# Patient Record
Sex: Male | Born: 1965 | Race: White | Hispanic: No | Marital: Single | Smoking: Never smoker
Health system: Southern US, Community
[De-identification: ages and names within clinical notes are randomized; demographics above are authoritative.]

## PROBLEM LIST (undated history)

## (undated) DIAGNOSIS — J45909 Unspecified asthma, uncomplicated: Secondary | ICD-10-CM

## (undated) DIAGNOSIS — G43909 Migraine, unspecified, not intractable, without status migrainosus: Secondary | ICD-10-CM

## (undated) DIAGNOSIS — I10 Essential (primary) hypertension: Secondary | ICD-10-CM

## (undated) DIAGNOSIS — D860 Sarcoidosis of lung: Secondary | ICD-10-CM

## (undated) DIAGNOSIS — J189 Pneumonia, unspecified organism: Secondary | ICD-10-CM

## (undated) DIAGNOSIS — J4520 Mild intermittent asthma, uncomplicated: Secondary | ICD-10-CM

## (undated) HISTORY — DX: Migraine, unspecified, not intractable, without status migrainosus: G43.909

## (undated) HISTORY — DX: Unspecified asthma, uncomplicated: J45.909

## (undated) HISTORY — DX: Essential (primary) hypertension: I10

## (undated) HISTORY — DX: Pneumonia, unspecified organism: J18.9

## (undated) HISTORY — PX: BRONCHOSCOPY: SUR163

## (undated) HISTORY — PX: HERNIA REPAIR: SHX51

## (undated) HISTORY — DX: Sarcoidosis of lung: D86.0

## (undated) HISTORY — DX: Mild intermittent asthma, uncomplicated: J45.20

## (undated) HISTORY — PX: TONSILLECTOMY: SUR1361

---

## 1998-12-04 DIAGNOSIS — J189 Pneumonia, unspecified organism: Secondary | ICD-10-CM

## 1998-12-04 HISTORY — DX: Pneumonia, unspecified organism: J18.9

## 2004-12-09 ENCOUNTER — Ambulatory Visit: Payer: Self-pay | Admitting: Family Medicine

## 2004-12-15 ENCOUNTER — Ambulatory Visit: Payer: Self-pay | Admitting: Family Medicine

## 2005-12-04 HISTORY — PX: REPLACEMENT TOTAL KNEE BILATERAL: SUR1225

## 2006-01-03 ENCOUNTER — Ambulatory Visit: Payer: Self-pay | Admitting: Family Medicine

## 2006-02-07 ENCOUNTER — Ambulatory Visit: Payer: Self-pay | Admitting: Family Medicine

## 2009-03-26 ENCOUNTER — Encounter: Payer: Self-pay | Admitting: Pulmonary Disease

## 2009-04-13 ENCOUNTER — Encounter: Payer: Self-pay | Admitting: Pulmonary Disease

## 2009-04-16 ENCOUNTER — Encounter: Payer: Self-pay | Admitting: Pulmonary Disease

## 2009-04-19 ENCOUNTER — Encounter: Payer: Self-pay | Admitting: Pulmonary Disease

## 2009-04-22 ENCOUNTER — Ambulatory Visit: Payer: Self-pay | Admitting: Pulmonary Disease

## 2009-04-22 DIAGNOSIS — J309 Allergic rhinitis, unspecified: Secondary | ICD-10-CM | POA: Insufficient documentation

## 2009-04-26 ENCOUNTER — Telehealth: Payer: Self-pay | Admitting: Pulmonary Disease

## 2009-05-04 ENCOUNTER — Telehealth (INDEPENDENT_AMBULATORY_CARE_PROVIDER_SITE_OTHER): Payer: Self-pay | Admitting: *Deleted

## 2009-05-05 ENCOUNTER — Encounter: Payer: Self-pay | Admitting: Thoracic Surgery

## 2009-05-05 ENCOUNTER — Ambulatory Visit: Payer: Self-pay | Admitting: Pulmonary Disease

## 2009-05-05 ENCOUNTER — Ambulatory Visit (HOSPITAL_COMMUNITY): Admission: RE | Admit: 2009-05-05 | Discharge: 2009-05-05 | Payer: Self-pay | Admitting: Thoracic Surgery

## 2009-05-07 ENCOUNTER — Telehealth: Payer: Self-pay | Admitting: Pulmonary Disease

## 2009-05-10 ENCOUNTER — Encounter: Payer: Self-pay | Admitting: Pulmonary Disease

## 2009-05-19 ENCOUNTER — Ambulatory Visit: Payer: Self-pay | Admitting: Pulmonary Disease

## 2009-05-19 DIAGNOSIS — D869 Sarcoidosis, unspecified: Secondary | ICD-10-CM | POA: Insufficient documentation

## 2009-06-16 ENCOUNTER — Ambulatory Visit: Payer: Self-pay | Admitting: Pulmonary Disease

## 2009-06-16 ENCOUNTER — Encounter: Payer: Self-pay | Admitting: Pulmonary Disease

## 2009-06-16 DIAGNOSIS — G4733 Obstructive sleep apnea (adult) (pediatric): Secondary | ICD-10-CM | POA: Insufficient documentation

## 2009-07-30 ENCOUNTER — Ambulatory Visit: Payer: Self-pay | Admitting: Pulmonary Disease

## 2009-08-27 ENCOUNTER — Ambulatory Visit: Payer: Self-pay | Admitting: Pulmonary Disease

## 2009-09-24 ENCOUNTER — Ambulatory Visit: Payer: Self-pay | Admitting: Pulmonary Disease

## 2009-09-24 DIAGNOSIS — J4520 Mild intermittent asthma, uncomplicated: Secondary | ICD-10-CM | POA: Insufficient documentation

## 2009-11-09 ENCOUNTER — Ambulatory Visit: Payer: Self-pay | Admitting: Pulmonary Disease

## 2010-01-14 ENCOUNTER — Ambulatory Visit: Payer: Self-pay | Admitting: Pulmonary Disease

## 2010-01-14 ENCOUNTER — Encounter: Payer: Self-pay | Admitting: Pulmonary Disease

## 2010-04-25 ENCOUNTER — Ambulatory Visit: Payer: Self-pay | Admitting: Pulmonary Disease

## 2010-06-08 ENCOUNTER — Ambulatory Visit: Payer: Self-pay | Admitting: Pulmonary Disease

## 2010-09-05 ENCOUNTER — Telehealth (INDEPENDENT_AMBULATORY_CARE_PROVIDER_SITE_OTHER): Payer: Self-pay | Admitting: *Deleted

## 2010-12-21 ENCOUNTER — Ambulatory Visit
Admission: RE | Admit: 2010-12-21 | Discharge: 2010-12-21 | Payer: Self-pay | Source: Home / Self Care | Attending: Pulmonary Disease | Admitting: Pulmonary Disease

## 2010-12-21 DIAGNOSIS — J019 Acute sinusitis, unspecified: Secondary | ICD-10-CM | POA: Insufficient documentation

## 2011-01-04 NOTE — Miscellaneous (Signed)
Summary: Orders Update pft charges  Clinical Lists Changes  Orders: Added new Service order of Carbon Monoxide diffusing w/capacity (94720) - Signed Added new Service order of Lung Volumes (94240) - Signed Added new Service order of Spirometry (Pre & Post) (94060) - Signed 

## 2011-01-04 NOTE — Assessment & Plan Note (Signed)
Summary: rov/pft/apc   Copy to:    Primary Provider/Referring Provider:  Dennison Bulla, MD  CC:  Sarcoid. Asthma. Follow-up with PFT's..  History of Present Illness: 45 yo male with biopsy proven pulmonary sarcoid, rhinitis, probable asthma and snoring.  His symptoms have been stable.  He still has some cough, but this is not much trouble.  He still gets some sinus congestion.  He is not having wheeze, fever, sweats, or sputum.  He gets a tickle in his throat, and this happens more in the morning.  He continues to snore.  Current Medications (verified): 1)  Flonase 50 Mcg/act Susp (Fluticasone Propionate) .Marland Kitchen.. 1 Spray in Each Nostril Daily 2)  Symbicort 160-4.5 Mcg/act Aero (Budesonide-Formoterol Fumarate) .... Two Puffs Two Times A Day 3)  Proair Hfa 108 (90 Base) Mcg/act Aers (Albuterol Sulfate) .... Two Puffs Up To Four Times Per Day As Needed  Allergies (verified): No Known Drug Allergies  Past History:  Past Medical History: Allergic Rhinitis      - 09/24/09 RAST negative, IgE 10.8 Probable asthma Pneumonia 2000 Pulmonary sarcoidosis      - biopsy proven June 2010      - PFT 01/14/10 FEV1 4.37(105%), fvc 6.84(121%), FEV1% 64, TLC 9.48(120%), DLCO 125%, no BD  Past Surgical History: Reviewed history from 05/19/2009 and no changes required. Hernia surgery Tonsillectomy Bronchoscopy May 05, 2009  Vital Signs:  Patient profile:   45 year old male Height:      74 inches (187.96 cm) Weight:      274 pounds (124.55 kg) BMI:     35.31 O2 Sat:      94 % on Room air Temp:     97.9 degrees F (36.61 degrees C) oral Pulse rate:   77 / minute BP sitting:   120 / 82  (left arm) Cuff size:   large  Vitals Entered By: Michel Bickers CMA (January 14, 2010 1:55 PM)  O2 Sat at Rest %:  94 O2 Flow:  Room air CC: Sarcoid. Asthma. Follow-up with PFT's.   Physical Exam  General:  normal appearance, healthy appearing, and obese.   Nose:  clear nasal discharge, no sinus  tenderness Mouth:  MP 4 airway, enlarged tongue with scalloped borders Neck:  JVD, no thyromegaly Lungs:  clear bilaterally to auscultation and percussion Heart:  regular rate and rhythm, S1, S2 without murmurs, rubs, gallops, or clicks Abdomen:  bowel sounds positive; abdomen soft and non-tender without masses, or organomegaly Extremities:  no clubbing, cyanosis, edema, or deformity noted Cervical Nodes:  no significant adenopathy   Impression & Recommendations:  Problem # 1:  ASTHMA (ICD-493.90) Will change from symbicort to qvar.  Problem # 2:  SARCOIDOSIS (ICD-135) Will repeat his chest xray.  Problem # 3:  ALLERGIC RHINITIS (ICD-477.9)  He is to continue flonase, and emphasized the importance of using his netty pott.  Orders: Est. Patient Level III (99213) T-2 View CXR (71020TC)  Problem # 4:  SNORING (ICD-786.09)  Will continue clinical monitoring.  He has gained about 10 lbs since June 2010.  Orders: Est. Patient Level III (11914) T-2 View CXR (71020TC)  Medications Added to Medication List This Visit: 1)  Qvar 80 Mcg/act Aers (Beclomethasone dipropionate) .... Two puffs two times a day  Complete Medication List: 1)  Flonase 50 Mcg/act Susp (Fluticasone propionate) .Marland Kitchen.. 1 spray in each nostril daily 2)  Qvar 80 Mcg/act Aers (Beclomethasone dipropionate) .... Two puffs two times a day 3)  Proair Hfa 108 (90 Base)  Mcg/act Aers (Albuterol sulfate) .... Two puffs up to four times per day as needed  Patient Instructions: 1)  Stop symbicort 2)  Qvar two puffs two times a day 3)  Chest xray today 4)  Follow up in 2 to 3 months Prescriptions: QVAR 80 MCG/ACT AERS (BECLOMETHASONE DIPROPIONATE) two puffs two times a day  #1 x 4   Entered and Authorized by:   Coralyn Helling MD   Signed by:   Coralyn Helling MD on 01/14/2010   Method used:   Electronically to        [facility]. [phone]* (retail)       [address]       [place], [place]  [address]       Ph: [phone] or [phone]       Fax: [phone]   RxID:   [id number]

## 2011-01-04 NOTE — Miscellaneous (Signed)
Summary: Pulmonary function test   Pulmonary Function Test Date: 01/14/2010 Height (in.): 74 Gender: Male  Pre-Spirometry FVC    Value: 6.84 L/min   Pred: 5.64 L/min     % Pred: 121 % FEV1    Value: 4.37 L     Pred: 4.17 L     % Pred: 105 % FEV1/FVC  Value: 64 %     Pred: 74 %     % Pred: . % FEF 25-75  Value: 2.75 L/min   Pred: 4.06 L/min     % Pred: 68 %  Post-Spirometry FVC    Value: 6.76 L/min   Pred: 5.64 L/min     % Pred: 120 % FEV1    Value: 4.05 L     Pred: 4.17 L     % Pred: 97 % FEV1/FVC  Value: 60 %     Pred: 74 %     % Pred: . % FEF 25-75  Value: 2.37 L/min   Pred: 4.06 L/min     % Pred: 58 %  Lung Volumes TLC    Value: 9.48 L   % Pred: 120 % RV    Value: 2.65 L   % Pred: 112 % DLCO    Value: 43.7 %   % Pred: 125 % DLCO/VA  Value: 5.29 %   % Pred: 127 %  Comments: Small airway obstruction.  No bronchodilator response.  Normal lung volumes.  Normal diffusion capacity. Clinical Lists Changes  Observations: Added new observation of PFT COMMENTS: Small airway obstruction.  No bronchodilator response.  Normal lung volumes.  Normal diffusion capacity. (01/14/2010 15:39) Added new observation of DLCO/VA%EXP: 127 % (01/14/2010 15:39) Added new observation of DLCO/VA: 5.29 % (01/14/2010 15:39) Added new observation of DLCO % EXPEC: 125 % (01/14/2010 15:39) Added new observation of DLCO: 43.7 % (01/14/2010 15:39) Added new observation of RV % EXPECT: 112 % (01/14/2010 15:39) Added new observation of RV: 2.65 L (01/14/2010 15:39) Added new observation of TLC % EXPECT: 120 % (01/14/2010 15:39) Added new observation of TLC: 9.48 L (01/14/2010 15:39) Added new observation of FEF2575%EXPS: 58 % (01/14/2010 15:39) Added new observation of PSTFEF25/75P: 4.06  (01/14/2010 15:39) Added new observation of PSTFEF25/75%: 2.37 L/min (01/14/2010 15:39) Added new observation of PSTFEV1/FCV%: . % (01/14/2010 15:39) Added new observation of FEV1FVCPRDPS: 74 % (01/14/2010 15:39) Added  new observation of PSTFEV1/FVC: 60 % (01/14/2010 15:39) Added new observation of POSTFEV1%PRD: 97 % (01/14/2010 15:39) Added new observation of FEV1PRDPST: 4.17 L (01/14/2010 15:39) Added new observation of POST FEV1: 4.05 L/min (01/14/2010 15:39) Added new observation of POST FVC%EXP: 120 % (01/14/2010 15:39) Added new observation of FVCPRDPST: 5.64 L/min (01/14/2010 15:39) Added new observation of POST FVC: 6.76 L (01/14/2010 15:39) Added new observation of FEF % EXPEC: 68 % (01/14/2010 15:39) Added new observation of FEF25-75%PRE: 4.06 L/min (01/14/2010 15:39) Added new observation of FEF 25-75%: 2.75 L/min (01/14/2010 15:39) Added new observation of FEV1/FVC%EXP: . % (01/14/2010 15:39) Added new observation of FEV1/FVC PRE: 74 % (01/14/2010 15:39) Added new observation of FEV1/FVC: 64 % (01/14/2010 15:39) Added new observation of FEV1 % EXP: 105 % (01/14/2010 15:39) Added new observation of FEV1 PREDICT: 4.17 L (01/14/2010 15:39) Added new observation of FEV1: 4.37 L (01/14/2010 15:39) Added new observation of FVC % EXPECT: 121 % (01/14/2010 15:39) Added new observation of FVC PREDICT: 5.64 L (01/14/2010 15:39) Added new observation of FVC: 6.84 L (01/14/2010 15:39) Added new observation of PFT DATE: 01/14/2010  (01/14/2010 15:39)

## 2011-01-04 NOTE — Progress Notes (Signed)
Summary: flu shot  Phone Note Call from Patient Call back at Home Phone [phone]   Caller: Patient Call For: sood Summary of Call: does dr sood want pt to have flu shot?  Initial call taken by: Tivis Ringer, CNA,  September 05, 2010 9:59 AM  Follow-up for Phone Call        Spoke with pt and advised that ther choice is his, but we rec that he get a fluvax to protect his self from getting the flu.  Pt verbalized understanding. Follow-up by: Vernie Murders,  September 05, 2010 10:36 AM

## 2011-01-04 NOTE — Assessment & Plan Note (Signed)
Summary: 3 months/apc   Visit Type:  Follow-up Copy to:  Dr. Benedetto Goad Primary Provider/Referring Provider:  Dennison Bulla, MD  CC:  Asthma.  Sarcoid.  The patient has no complaints today and says there is no change in his breathing.Rick Mejia  History of Present Illness: 45 yo male with biopsy proven pulmonary sarcoid, rhinitis, probable asthma and snoring.  His breathing has been doing okay.  He is not having wheeze.  He gets occasional cough in the morning, especially from the steam in his shower.  He has not used proair.  He has not noticed any difference since being on Qvar.    His sinuses have been okay.  He is using flonase once daily.  He has not been using nasal irrigation.  He denies chest pain, abdominal pain, vision changes, or skin rashes.    Current Medications (verified): 1)  Flonase 50 Mcg/act Susp (Fluticasone Propionate) .Rick Mejia.. 1 Spray in Each Nostril Daily 2)  Qvar 80 Mcg/act Aers (Beclomethasone Dipropionate) .... Two Puffs Two Times A Day 3)  Proair Hfa 108 (90 Base) Mcg/act Aers (Albuterol Sulfate) .... Two Puffs Up To Four Times Per Day As Needed  Allergies (verified): No Known Drug Allergies  Past History:  Past Medical History: Last updated: 01/14/2010 Allergic Rhinitis      - 09/24/09 RAST negative, IgE 10.8 Probable asthma Pneumonia 2000 Pulmonary sarcoidosis      - biopsy proven June 2010      - PFT 01/14/10 FEV1 4.37(105%), fvc 6.84(121%), FEV1% 64, TLC 9.48(120%), DLCO 125%, no BD  Past Surgical History: Last updated: 05/19/2009 Hernia surgery Tonsillectomy Bronchoscopy May 05, 2009  Vital Signs:  Patient profile:   45 year old male Height:      74 inches (187.96 cm) Weight:      289 pounds (131.36 kg) BMI:     37.24 O2 Sat:      96 % on Room air Temp:     97.6 degrees F (36.44 degrees C) oral Pulse rate:   86 / minute BP sitting:   120 / 82  (left arm) Cuff size:   large  Vitals Entered By: Michel Bickers CMA (Apr 25, 2010 2:23 PM)  O2 Sat  at Rest %:  96 O2 Flow:  Room air CC: Asthma.  Sarcoid.  The patient has no complaints today and says there is no change in his breathing. Comments Medications reviewed. Daytime phone verified. Michel Bickers CMA  Apr 25, 2010 2:24 PM   Physical Exam  General:  normal appearance, healthy appearing, and obese.   Nose:  clear nasal discharge, no sinus tenderness Mouth:  MP 4 airway, enlarged tongue with scalloped borders Neck:  JVD, no thyromegaly Lungs:  clear bilaterally to auscultation and percussion Heart:  regular rate and rhythm, S1, S2 without murmurs, rubs, gallops, or clicks Extremities:  no clubbing, cyanosis, edema, or deformity noted Cervical Nodes:  no significant adenopathy   Impression & Recommendations:  Problem # 1:  SARCOIDOSIS (ICD-135) His last chest xray was unremarkable.  I do not think his sarcoid is active at this time.  Will continue clinical monitoring.  Problem # 2:  ASTHMA (ICD-493.90) Will try to wean him off Qvar as tolerated, and see if he notices worsening of his symptoms as we taper off his inhaler regimen.  Problem # 3:  ALLERGIC RHINITIS (ICD-477.9)  He is to continue flonase, and emphasized the importance of using his netty pott.  Medications Added to Medication List This Visit: 1)  Qvar  80 Mcg/act Aers (Beclomethasone dipropionate) .... One puff two times a day for two weeks, then one puff at bedtime for two weeks, then stop qvar  Complete Medication List: 1)  Flonase 50 Mcg/act Susp (Fluticasone propionate) .Rick Mejia.. 1 spray in each nostril daily 2)  Qvar 80 Mcg/act Aers (Beclomethasone dipropionate) .... One puff two times a day for two weeks, then one puff at bedtime for two weeks, then stop qvar 3)  Proair Hfa 108 (90 Base) Mcg/act Aers (Albuterol sulfate) .... Two puffs up to four times per day as needed  Other Orders: Est. Patient Level III (13244)  Patient Instructions: 1)  Change Qvar to one puff two times a day for two weeks.  If okay,  then Qvar one puff at bedtime for two weeks.  If okay, then stop Qvar 2)  Follow up in 4 to 6 weeks

## 2011-01-04 NOTE — Assessment & Plan Note (Signed)
Summary: 4-6 weeks/apc   Visit Type:  Follow-up Copy to:  Dr. Benedetto Goad Primary Provider/Referring Provider:  Dennison Bulla, MD  CC:  Sarcoid.  Asthma.  The patient does c/o slight increased sob with exertion and wheezing and dry cough in the mornings. and Headache.  History of Present Illness: 45 yo male with biopsy proven pulmonary sarcoid, rhinitis, mild/intermittent asthma  He has not noticed any difference since he stopped Qvar.  He gets occasional cough in the morning after he showers.  He does not bring up sputum.  He has not had chest pain, fever, sweats, hoarseness, gland swelling, or leg swelling.  His sinuses have been okay, and he uses his flonase once daily.  He gets winded when going up stairs, but no problem on level ground.   Current Medications (verified): 1)  Flonase 50 Mcg/act Susp (Fluticasone Propionate) .Marland Kitchen.. 1 Spray in Each Nostril Daily  Allergies (verified): No Known Drug Allergies  Past History:  Past Surgical History: Last updated: 05/19/2009 Hernia surgery Tonsillectomy Bronchoscopy May 05, 2009  Past Medical History: Allergic Rhinitis      - 09/24/09 RAST negative, IgE 10.8 Mild, intermittent asthma Pneumonia 2000 Pulmonary sarcoidosis      - biopsy proven June 2010      - PFT 01/14/10 FEV1 4.37(105%), fvc 6.84(121%), FEV1% 64, TLC 9.48(120%), DLCO 125%, no BD  Vital Signs:  Patient profile:   45 year old male Height:      74 inches (187.96 cm) Weight:      291 pounds (132.27 kg) BMI:     37.50 O2 Sat:      96 % on t Temp:     98.2 degrees F (36.78 degrees C) oral Pulse rate:   91 / minute BP sitting:   120 / 60  (left arm) Cuff size:   large  Vitals Entered By: Michel Bickers CMA (June 08, 2010 2:27 PM)  O2 Flow:  t CC: Sarcoid.  Asthma.  The patient does c/o slight increased sob with exertion and wheezing and dry cough in the mornings., Headache Comments Medications reviewed and phone number verified. Michel Bickers [facility]  June 08, 2010  2:29 PM   Physical Exam  General:  normal appearance, healthy appearing, and obese.   Nose:  clear nasal discharge, no sinus tenderness Mouth:  MP 4 airway, enlarged tongue with scalloped borders Neck:  JVD, no thyromegaly Lungs:  clear bilaterally to auscultation and percussion Heart:  regular rate and rhythm, S1, S2 without murmurs, rubs, gallops, or clicks Extremities:  no clubbing, cyanosis, edema, or deformity noted Cervical Nodes:  no significant adenopathy   Impression & Recommendations:  Problem # 1:  SARCOIDOSIS (ICD-135) This is stable.  His last chest xray was in February 2011.  Advised him that his current breathing problems are more likely related to his weight and deconditioning.  Advised him to increase his exercise.  Problem # 2:  ASTHMA (ICD-493.90) He has not noticed any worsening off his symptoms since stopping Qvar.  Problem # 3:  ALLERGIC RHINITIS (ICD-477.9)  He is to continue flonase, and emphasized the importance of using his netty pott.  Orders: Est. Patient Level III (16109)  Complete Medication List: 1)  Flonase 50 Mcg/act Susp (Fluticasone propionate) .Marland Kitchen.. 1 spray in each nostril daily  Patient Instructions: 1)  Follow up in 6 months

## 2011-01-05 NOTE — Assessment & Plan Note (Signed)
Summary: 6 months/apcv   Copy to:  Dr. Benedetto Goad Primary Provider/Referring Provider:  Dennison Bulla, MD  CC:  6 month follow up. Pt states his breathing has been "fine".  Pt c/o nasal congestion, frontal sinus headache, post nasal drip, and sneezing x 3 days. Rick Mejia  History of Present Illness: 45 yo male with biopsy proven pulmonary sarcoid, rhinitis, mild/intermittent asthma  He was doing well until this past weekend.  He started getting sinus pressure, sneezing, and clear to yellow drainage.  He has also been getting a headache.  He denies fever, sore throat, neck swelling, chest pain, or wheezing.  He has been using OTC sinus medications with partial improvement.    Current Medications (verified): 1)  Flonase 50 Mcg/act Susp (Fluticasone Propionate) .Rick Mejia.. 1 Spray in Each Nostril Daily  Allergies (verified): No Known Drug Allergies  Past History:  Past Medical History: Reviewed history from 06/08/2010 and no changes required. Allergic Rhinitis      - 09/24/09 RAST negative, IgE 10.8 Mild, intermittent asthma Pneumonia 2000 Pulmonary sarcoidosis      - biopsy proven June 2010      - PFT 01/14/10 FEV1 4.37(105%), fvc 6.84(121%), FEV1% 64, TLC 9.48(120%), DLCO 125%, no BD  Past Surgical History: Reviewed history from 05/19/2009 and no changes required. Hernia surgery Tonsillectomy Bronchoscopy May 05, 2009  Vital Signs:  Patient profile:   45 year old male Height:      74 inches Weight:      266.25 pounds BMI:     34.31 O2 Sat:      96 % on Room air Temp:     98.1 degrees F oral Pulse rate:   77 / minute BP sitting:   124 / 78  (left arm) Cuff size:   large  Vitals Entered By: Carver Fila (December 21, 2010 1:25 PM)  O2 Flow:  Room air CC: 6 month follow up. Pt states his breathing has been "fine".  Pt c/o nasal congestion,  frontal sinus headache, post nasal drip, sneezing x 3 days.  Comments meds and allergies updated Phone number updated [name]  December 21, 2010 1:25 PM    Physical Exam  General:  normal appearance, healthy appearing, and obese.   Ears:  cerumen build up, otherwise normal Nose:  clear to yellow drainage, mild maxillary tenderness Mouth:  no exudate Neck:  no JVD.   Lungs:  clear bilaterally to auscultation and percussion Heart:  regular rate and rhythm, S1, S2 without murmurs, rubs, gallops, or clicks Extremities:  no clubbing, cyanosis, edema, or deformity noted Neurologic:  normal CN II-XII and strength normal.   Cervical Nodes:  no significant adenopathy Psych:  alert and cooperative; normal mood and affect; normal attention span and concentration   Impression & Recommendations:  Problem # 1:  SINUSITIS, ACUTE (ICD-461.9) Likely has viral sinusitis.  Advised to try nasal irrigation and as needed benadryl.  He can continue as needed delsym also.  Have given him script for cefuroxime, and explained that he should not use this unless his symptoms get worse.  He is to call prior to starting antibiotics.  Problem # 2:  SARCOIDOSIS (ICD-135) Stable.  Will repeat chest xray today and call with results.  Medications Added to Medication List This Visit: 1)  Cefuroxime Axetil 250 Mg Tabs (Cefuroxime axetil) .... One two times a day two times a day  Complete Medication List: 1)  Flonase 50 Mcg/act Susp (Fluticasone propionate) .Rick Mejia.. 1 spray in each nostril daily 2)  Cefuroxime Axetil 250 Mg Tabs (Cefuroxime axetil) .... One two times a day two times a day  Other Orders: T-2 View CXR (71020TC) Est. Patient Level IV (16109)  Patient Instructions: 1)  Cefuroxime 250 mg two times a day for 10 days if sinus symptoms get worse 2)  Try using saline rinse for nose 3)  Use benadryl 50 mg at bedtime as needed until sinus symptoms improve 4)  Chest xray today 5)  Follow up in 6 months Prescriptions: CEFUROXIME AXETIL 250 MG TABS (CEFUROXIME AXETIL) one two times a day two times a day  #20 x 0   Entered and Authorized by:    Coralyn Helling MD   Signed by:   Coralyn Helling MD on 12/21/2010   Method used:   Print then Give to Patient   RxID:   [id number]    Immunization History:  Influenza Immunization History:    Influenza:  historical (11/03/2010)

## 2011-03-13 LAB — AFB CULTURE WITH SMEAR (NOT AT ARMC): Acid Fast Smear: NONE SEEN

## 2011-03-13 LAB — CULTURE, RESPIRATORY W GRAM STAIN: Culture: NO GROWTH

## 2011-03-13 LAB — FUNGUS CULTURE W SMEAR

## 2011-03-14 LAB — COMPREHENSIVE METABOLIC PANEL
ALT: 24 U/L (ref 0–53)
AST: 25 U/L (ref 0–37)
CO2: 30 mEq/L (ref 19–32)
Chloride: 105 mEq/L (ref 96–112)
GFR calc Af Amer: >60 mL/min (ref >60)
GFR calc non Af Amer: >60 mL/min (ref >60)
Sodium: 140 mEq/L (ref 135–145)
Total Bilirubin: 1 mg/dL (ref 0.3–1.2)

## 2011-03-14 LAB — CBC
RBC: 5.45 MIL/uL (ref 4.22–5.81)
WBC: 6.8 10*3/uL (ref 4.0–10.5)

## 2011-04-18 NOTE — Op Note (Signed)
NAME:  YORDIN, RHODA                ACCOUNT NO.:  [id number]   MEDICAL RECORD NO.:  [id number]          PATIENT TYPE:  AMB   LOCATION:  SDS                          FACILITY:  [facility]   PHYSICIAN:  Coralyn Helling, MD        DATE OF BIRTH:  02/15/1966   DATE OF PROCEDURE:  05/05/2009  DATE OF DISCHARGE:                               OPERATIVE REPORT   PREOPERATIVE DIAGNOSIS:  Mediastinal and hilar lymphadenopathy, rule out  sarcoidosis.   POSTOPERATIVE DIAGNOSIS:  Mediastinal and hilar adenopathy, rule out  sarcoidosis.   PROCEDURE:  Bronchoscopy with endobronchial ultrasound-guided biopsy.   INDICATION:  Mr. Minix is a 45 year old male who was found to have  mediastinal and hilar adenopathy on chest x-ray and CT scan of the  chest.  As a result, he was scheduled to undergo a tissue sampling for  further evaluation of this.  The procedure was explained to the patient.  Risks were detailed as bleeding, infection, pneumothorax, and no  diagnosis.  Consent form was signed.  The procedure was done in the  operating room under general anesthesia.  Please review the accompanying  anesthesiology note for information regarding this.  The diagnostic  bronchoscope was initially entered through the endotracheal tube.  The  carina was visualized and appeared to be somewhat widened.  The mucosa  appeared to be somewhat inflamed.  The left main bronchus was then  entered.  The left upper lingular and lower lobe orifices were  visualized.  There did appear to be some inflammation and edema around  the left upper lobe orifice, but there were no obvious endobronchial  lesions.  The bronchoscope was then entered to the right main bronchus.  The right upper, middle, and lower lobe orifices were visualized.  Again, the airways appeared to be somewhat edematous, particularly in  the right middle lobe, there appeared to be some narrowing of the right  middle lobe orifice with cobblestoning of the mucosa.   Initially, BAL  was done from the right middle lobe with 60 mL of saline instilled with  approximately 20 mL of white cloudy fluid returned.  Then, brush  cytology was done from the right middle lobe.  Then, endobronchial  biopsy was done with 3 biopsy specimens obtained.  The diagnostic  bronchoscope was withdrawn and the endobronchial ultrasound scope was  inserted.  Then using ultrasound guidance, fine needle aspiration was  done from the subcarinal node with 1 pass from there, followed by 1 pass  from the 4R lymph node and then 2 passes from the 10R lymph node.  There  was a minimal amount of bleeding which was  spontaneously.  Otherwise,  there were no obvious complications.  The bronchoscope was withdrawn,  and the patient was returned to recovery room in stable condition.  The  specimens will be sent for cytology, microbiology, and surgical  pathology.  A postprocedure chest x-ray is pending at this time.  Assistance for the procedure were Dr. Cameron Proud and Dr.  Levy Pupa.      Coralyn Helling, MD  Electronically Signed  VS/MEDQ  D:  05/05/2009  T:  05/05/2009  Job:  [id number]

## 2011-07-03 ENCOUNTER — Encounter: Payer: Self-pay | Admitting: Pulmonary Disease

## 2011-07-03 ENCOUNTER — Ambulatory Visit (INDEPENDENT_AMBULATORY_CARE_PROVIDER_SITE_OTHER): Payer: BC Managed Care – PPO | Admitting: Pulmonary Disease

## 2011-07-03 ENCOUNTER — Ambulatory Visit (INDEPENDENT_AMBULATORY_CARE_PROVIDER_SITE_OTHER)
Admission: RE | Admit: 2011-07-03 | Discharge: 2011-07-03 | Disposition: A | Discharge status: Home / Self Care | Payer: BC Managed Care – PPO | Source: Ambulatory Visit | Attending: Pulmonary Disease | Admitting: Pulmonary Disease

## 2011-07-03 VITALS — BP 120/70 | HR 73 | Temp 97.3°F | Ht 74.0 in | Wt 289.0 lb

## 2011-07-03 DIAGNOSIS — D869 Sarcoidosis, unspecified: Secondary | ICD-10-CM

## 2011-07-03 DIAGNOSIS — J309 Allergic rhinitis, unspecified: Secondary | ICD-10-CM

## 2011-07-03 DIAGNOSIS — R0609 Other forms of dyspnea: Secondary | ICD-10-CM

## 2011-07-03 DIAGNOSIS — R0989 Other specified symptoms and signs involving the circulatory and respiratory systems: Secondary | ICD-10-CM

## 2011-07-03 NOTE — Assessment & Plan Note (Signed)
Recent sleep study showed moderate sleep apnea based on RDI.  I have reviewed his sleep test results with the patient.  Explained how sleep apnea can affect the patient's health.  Driving precautions and importance of weight loss were discussed.  Treatment options for sleep apnea were reviewed.  He will d/w his dentist whether he would be a candidate for an oral appliance.  He will also review whether CPAP would be a suitable option for him.

## 2011-07-03 NOTE — Assessment & Plan Note (Signed)
He is concerned that his fatigue could be similar to his symptoms when he was dx with sarcoidosis.  Will repeat chest xray today to further assess.

## 2011-07-03 NOTE — Patient Instructions (Signed)
Chest xray today Call once you have made a decision about treating sleep apnea Follow up in 6 months

## 2011-07-03 NOTE — Progress Notes (Signed)
  Subjective:    Patient ID: Rick Mejia, male    DOB: [birth year], 45 y.o.   MRN: [id number]  HPI 45 yo male with pulmonary sarcoidosis, mild/intermittent asthma, and snoring.  He has noticed more trouble feeling sleepy during the day.  He has gained some weight.  He was worried that he had similar feelings prior to be dx with sarcoidosis.  He had sleep study recently at [place]>>PSG 0720/12 [facility] hospital>>RDI 19.1, supine RDI 20.1.  He denies chest pain, dyspnea, cough, wheeze, sputum, skin rash, sinus congestion, or gland swelling.   Review of Systems     Objective:   Physical Exam BP 120/70  Pulse 73  Temp(Src) 97.3 F (36.3 C) (Oral)  Ht 6\' 2"  (1.88 m)  Wt 289 lb (131.09 kg)  BMI 37.11 kg/m2  SpO2 98%  General: normal appearance, healthy appearing, and obese.  Ears: cerumen build up, otherwise normal  Nose: clear to yellow drainage, mild maxillary tenderness  Mouth: no exudate  Neck: no JVD.  Lungs: clear bilaterally to auscultation and percussion  Heart: regular rate and rhythm, S1, S2 without murmurs, rubs, gallops, or clicks  Extremities: no clubbing, cyanosis, edema, or deformity noted  Neurologic: normal CN II-XII and strength normal.  Cervical Nodes: no significant adenopathy  Psych: alert and cooperative; normal mood and affect; normal attention span and concentration       Assessment & Plan:   OSA (obstructive sleep apnea) Recent sleep study showed moderate sleep apnea based on RDI.  I have reviewed his sleep test results with the patient.  Explained how sleep apnea can affect the patient's health.  Driving precautions and importance of weight loss were discussed.  Treatment options for sleep apnea were reviewed.  He will d/w his dentist whether he would be a candidate for an oral appliance.  He will also review whether CPAP would be a suitable option for him.  Sarcoidosis He is concerned that his fatigue could be similar to his symptoms when he was  dx with sarcoidosis.  Will repeat chest xray today to further assess.    Updated Medication List Outpatient Encounter Prescriptions as of 07/03/2011  Medication Sig Dispense Refill  . fluticasone (FLONASE) 50 MCG/ACT nasal spray Place 1 spray into the nose daily.        Marland Kitchen DISCONTD: cefUROXime (CEFTIN) 250 MG tablet Take 250 mg by mouth 2 (two) times daily.

## 2011-07-04 ENCOUNTER — Telehealth: Payer: Self-pay | Admitting: Pulmonary Disease

## 2011-07-04 NOTE — Telephone Encounter (Signed)
Spoke w/ pt and advised him of cxr results. Pt verbalized understanding and had no questions

## 2011-07-04 NOTE — Telephone Encounter (Signed)
Chest xray from June 30 reviewed.  No active disease.  Will have my nurse inform pt that CXR was good, no evidence for recurrence of sarcoidosis.

## 2011-12-22 ENCOUNTER — Encounter: Payer: Self-pay | Admitting: Pulmonary Disease

## 2011-12-22 ENCOUNTER — Ambulatory Visit (INDEPENDENT_AMBULATORY_CARE_PROVIDER_SITE_OTHER): Payer: BC Managed Care – PPO | Admitting: Pulmonary Disease

## 2011-12-22 DIAGNOSIS — J309 Allergic rhinitis, unspecified: Secondary | ICD-10-CM

## 2011-12-22 DIAGNOSIS — D869 Sarcoidosis, unspecified: Secondary | ICD-10-CM

## 2011-12-22 DIAGNOSIS — J45909 Unspecified asthma, uncomplicated: Secondary | ICD-10-CM

## 2011-12-22 DIAGNOSIS — G4733 Obstructive sleep apnea (adult) (pediatric): Secondary | ICD-10-CM

## 2011-12-22 DIAGNOSIS — J4520 Mild intermittent asthma, uncomplicated: Secondary | ICD-10-CM

## 2011-12-22 NOTE — Assessment & Plan Note (Signed)
Flonase as needed

## 2011-12-22 NOTE — Patient Instructions (Signed)
Follow up in 6 months 

## 2011-12-22 NOTE — Assessment & Plan Note (Signed)
Will speak with his dentist about why he is not a candidate for an oral appliance.  Advised him to reconsider CPAP.

## 2011-12-22 NOTE — Assessment & Plan Note (Signed)
Stable.  Monitor clinically. 

## 2011-12-22 NOTE — Progress Notes (Signed)
Chief Complaint  Patient presents with  . Follow-up    Pt states his breathing has been "okay". Pt denies any cough, wheezing, chest tightness. pt overall has been doing fine    History of Present Illness: Rick Mejia is a 46 y.o. male with pulmonary sarcoidosis, mild/intermittent asthma, and OSA.  He denies cough, wheeze, sputum, chest pain, skin rash, gland swelling, leg swelling.  His sinuses have been okay.  He was seen by his dentist, and advised against oral appliance [phone] Dr. Adele Dan)   Past Medical History  Diagnosis Date  . Allergic rhinitis     09/24/09 rast negative, Ige 10.8  . Asthma, mild intermittent   . Pulmonary sarcoidosis   . Pneumonia 2000    Past Surgical History  Procedure Date  . Tonsillectomy   . Hernia repair   . Bronchoscopy     No Known Allergies  Physical Exam:  Pulse 61, temperature 98 F (36.7 C), temperature source Oral, height 6\' 2"  (1.88 m), weight 282 lb 3.2 oz (128.005 kg), SpO2 95.00%. Body mass index is 36.23 kg/(m^2). Wt Readings from Last 2 Encounters:  12/22/11 282 lb 3.2 oz (128.005 kg)  07/03/11 289 lb (131.09 kg)    General - obese HEENT - no sinus tenderness, MP 4, retrognathic Cardiac - s1s2 regular Chest - no wheeze/rales Abdomen - soft, nontender Extremities - no edema Skin - no rashes Neurologic - normal strength Psychiatric - normal mood, behavior   Assessment/Plan:  Outpatient Encounter Prescriptions as of 12/22/2011  Medication Sig Dispense Refill  . fluticasone (FLONASE) 50 MCG/ACT nasal spray Place 1 spray into the nose daily.         Pager:  [phone]

## 2012-05-25 ENCOUNTER — Ambulatory Visit (HOSPITAL_COMMUNITY): Payer: Self-pay

## 2012-06-26 ENCOUNTER — Ambulatory Visit (INDEPENDENT_AMBULATORY_CARE_PROVIDER_SITE_OTHER): Payer: BC Managed Care – PPO | Admitting: Pulmonary Disease

## 2012-06-26 ENCOUNTER — Encounter: Payer: Self-pay | Admitting: Pulmonary Disease

## 2012-06-26 ENCOUNTER — Ambulatory Visit (INDEPENDENT_AMBULATORY_CARE_PROVIDER_SITE_OTHER)
Admission: RE | Admit: 2012-06-26 | Discharge: 2012-06-26 | Disposition: A | Discharge status: Home / Self Care | Payer: BC Managed Care – PPO | Source: Ambulatory Visit | Attending: Pulmonary Disease | Admitting: Pulmonary Disease

## 2012-06-26 VITALS — BP 118/72 | HR 72 | Temp 98.2°F | Ht 74.0 in | Wt 295.8 lb

## 2012-06-26 DIAGNOSIS — J4520 Mild intermittent asthma, uncomplicated: Secondary | ICD-10-CM

## 2012-06-26 DIAGNOSIS — J309 Allergic rhinitis, unspecified: Secondary | ICD-10-CM

## 2012-06-26 DIAGNOSIS — J45909 Unspecified asthma, uncomplicated: Secondary | ICD-10-CM

## 2012-06-26 DIAGNOSIS — D869 Sarcoidosis, unspecified: Secondary | ICD-10-CM

## 2012-06-26 DIAGNOSIS — G4733 Obstructive sleep apnea (adult) (pediatric): Secondary | ICD-10-CM

## 2012-06-26 NOTE — Patient Instructions (Signed)
Chest xray today>>will call with results Will arrange for home sleep study Will call to arrange follow up as needed after home sleep study reviewed

## 2012-06-26 NOTE — Assessment & Plan Note (Addendum)
He has snoring, sleep disruption, and daytime sleepiness.  He has increase in weight over the past one year.  He had sleep study in July 2012 which showed moderate OSA based on RDI.  He would like to have additional testing to determine if he really has sleep apnea before committing to therapy.  Will arrange for home sleep study to further assess, and then determine if he needs to have therapy for sleep apnea.

## 2012-06-26 NOTE — Assessment & Plan Note (Signed)
Stable.  Advised he can use flonase and OTC zyrtec as needed.

## 2012-06-26 NOTE — Assessment & Plan Note (Signed)
Not much of an issue at present. 

## 2012-06-26 NOTE — Assessment & Plan Note (Signed)
Stable clinically.  Will repeat chest xray today>>if this is normal, then he would not need additional follow up for his sarcoidosis unless he develops new respiratory symptoms.

## 2012-06-26 NOTE — Progress Notes (Signed)
Chief Complaint  Patient presents with  . Follow-up    breathing has been okay. denies any cough, wheezing, PND,chest tx. has no complaints today    History of Present Illness: Rick Mejia is a 46 y.o. male with pulmonary sarcoidosis, mild/intermittent asthma, and OSA.  His breathing has been doing well.  He denies cough, wheeze, sputum, fever, gland swelling, chest pain, or skin rash.  His sinuses are doing okay. He is using flonase and zyrtec, and these help.  He still snores, and feels tired during the day.  He will also have trouble with a cough sometimes while asleep.  He has gained about 13 lbs since his last visit.  His Epworth score is 6 out of 24.   Past Medical History  Diagnosis Date  . Allergic rhinitis     09/24/09 rast negative, Ige 10.8  . Asthma, mild intermittent   . Pulmonary sarcoidosis   . Pneumonia 2000    Past Surgical History  Procedure Date  . Tonsillectomy   . Hernia repair   . Bronchoscopy     No Known Allergies  Physical Exam:  Blood pressure 118/72, pulse 72, temperature 98.2 F (36.8 C), temperature source Oral, height 6\' 2"  (1.88 m), weight 295 lb 12.8 oz (134.174 kg), SpO2 95.00%.  Body mass index is 37.98 kg/(m^2). Wt Readings from Last 3 Encounters:  06/26/12 295 lb 12.8 oz (134.174 kg)  12/22/11 282 lb 3.2 oz (128.005 kg)  07/03/11 289 lb (131.09 kg)     General - obese HEENT - no sinus tenderness, MP 4, retrognathic Cardiac - s1s2 regular Chest - no wheeze/rales Abdomen - soft, nontender Extremities - no edema Skin - no rashes Neurologic - normal strength Psychiatric - normal mood, behavior   Assessment/Plan:  Outpatient Encounter Prescriptions as of 06/26/2012  Medication Sig Dispense Refill  . fluticasone (FLONASE) 50 MCG/ACT nasal spray Place 1 spray into the nose daily.         Coralyn Helling, MD [facility] Pulmonary/Critical Care 06/26/2012, 3:34 PM Pager:  [phone] After 3pm call: [phone]

## 2012-06-27 ENCOUNTER — Telehealth: Payer: Self-pay | Admitting: Pulmonary Disease

## 2012-06-27 NOTE — Telephone Encounter (Signed)
I spoke with patient about results and he verbalized understanding and had no questions 

## 2012-06-27 NOTE — Telephone Encounter (Signed)
Dg Chest 2 View  06/26/2012  *RADIOLOGY REPORT*  Clinical Data: Follow up sarcoid  CHEST - 2 VIEW  Comparison: 07/03/11  Findings: The heart size and mediastinal contours are within normal limits.  Both lungs are clear.  The visualized skeletal structures are unremarkable.  IMPRESSION: Stable exam.  No active cardiopulmonary process.  Original Report Authenticated By: Rosealee Albee, M.D.    Will have my nurse inform pt that CXR was normal.  No evidence for sarcoidosis.

## 2012-07-18 ENCOUNTER — Telehealth: Payer: Self-pay | Admitting: Pulmonary Disease

## 2012-07-18 DIAGNOSIS — G4733 Obstructive sleep apnea (adult) (pediatric): Secondary | ICD-10-CM

## 2012-07-18 NOTE — Telephone Encounter (Signed)
Home sleep study 07/02/12>>AHI 6.3, SpO2 low 81%.  Results d/w pt over the phone.  Explained he has mild sleep apnea.    He will try to work on his weight.  I have advised him to call back if this is unsuccessful.

## 2012-07-19 ENCOUNTER — Ambulatory Visit (INDEPENDENT_AMBULATORY_CARE_PROVIDER_SITE_OTHER): Payer: BC Managed Care – PPO | Admitting: Pulmonary Disease

## 2012-07-19 DIAGNOSIS — G4733 Obstructive sleep apnea (adult) (pediatric): Secondary | ICD-10-CM

## 2013-12-01 IMAGING — CR DG CHEST 2V
2 series · 2 of 2 positions shown · non-contrast
Comparison: 07/03/11

CLINICAL DATA: Follow up sarcoid

CHEST - 2 VIEW

[view not recorded (1 of 2)]
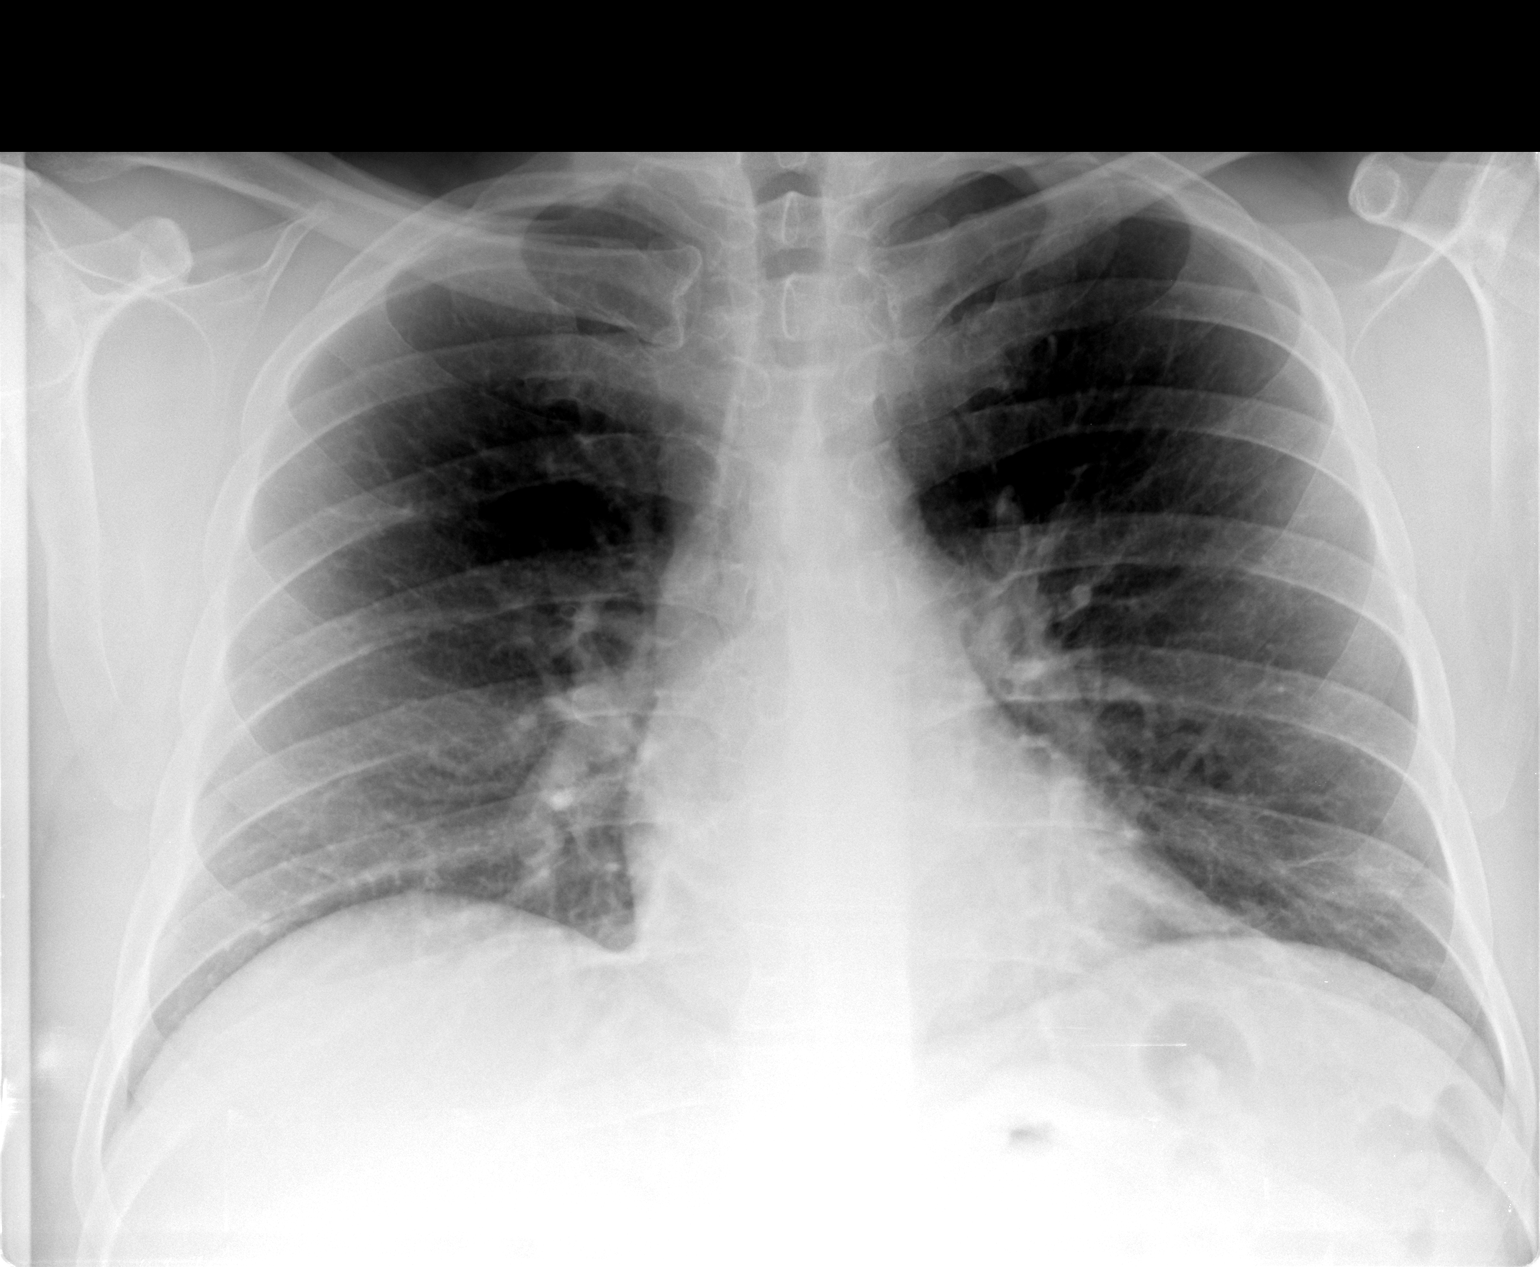

[view not recorded (2 of 2)]
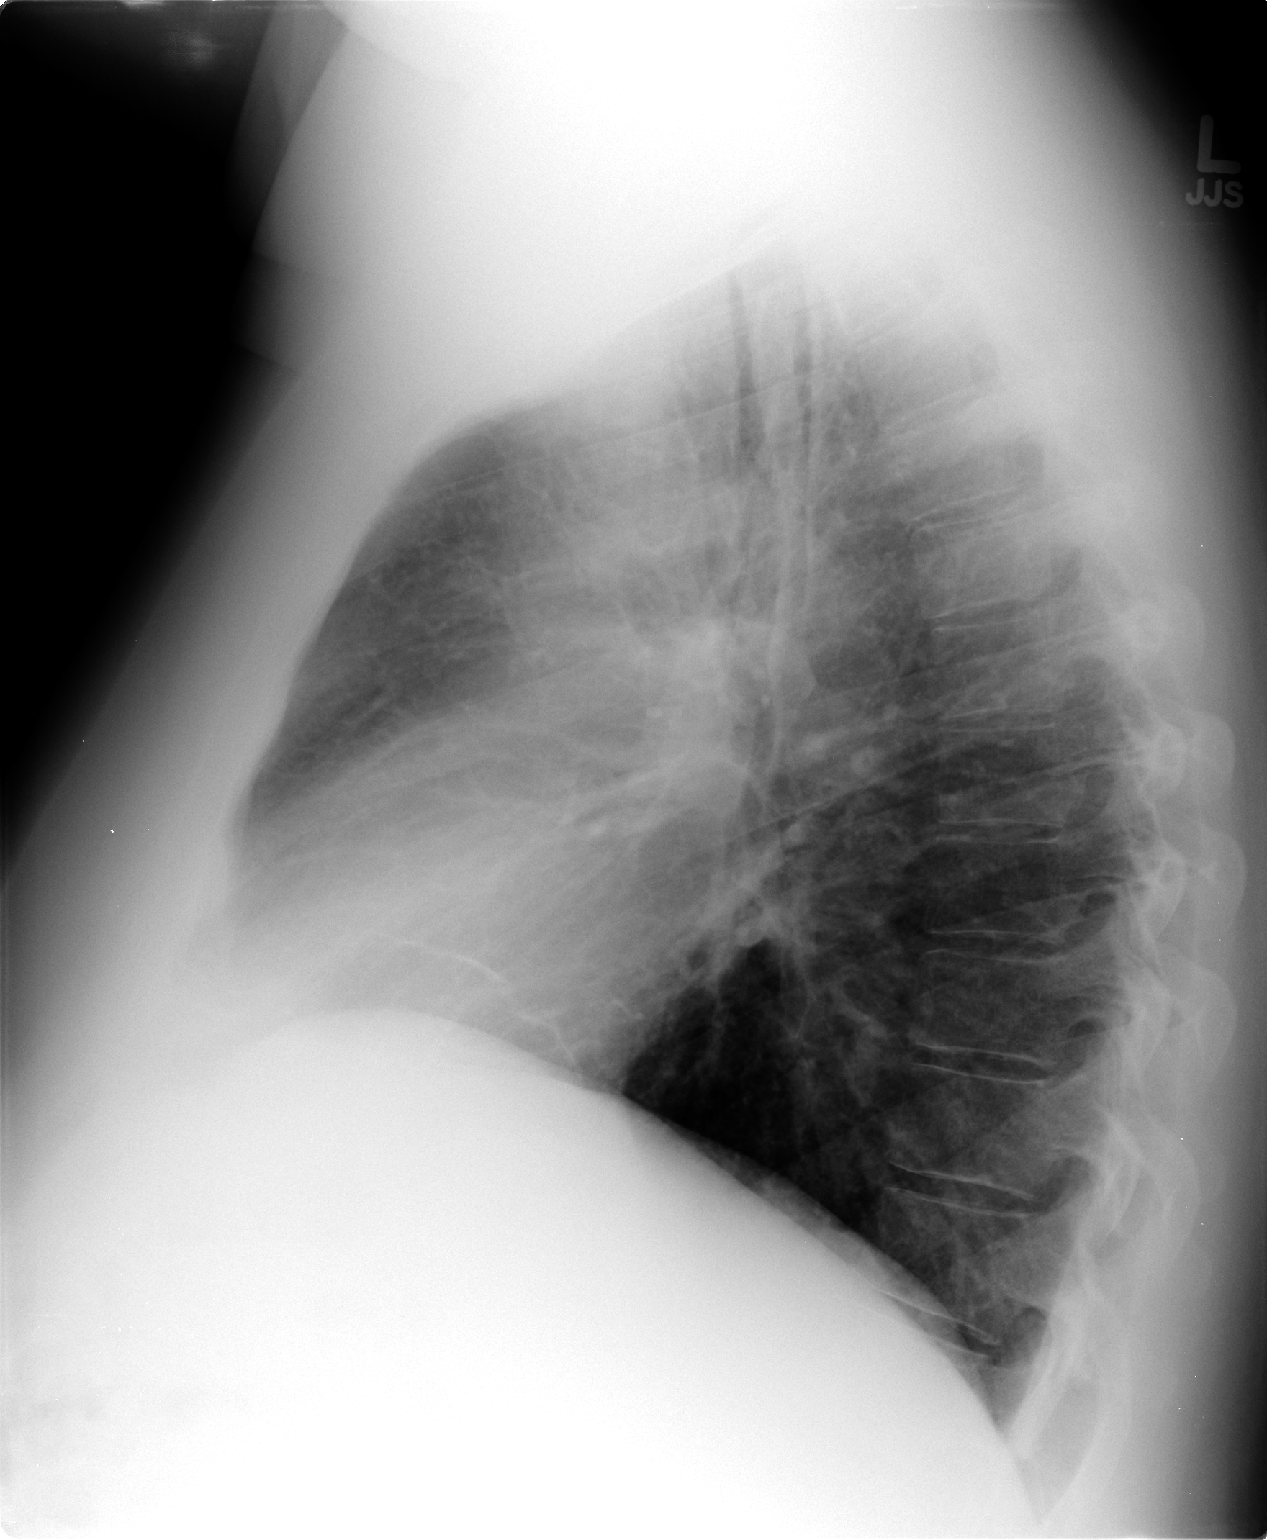

[2 of 2 positions shown; findings below may reference images not displayed]

FINDINGS: The heart size and mediastinal contours are within normal
limits.  Both lungs are clear.  The visualized skeletal structures
are unremarkable.
IMPRESSION: Stable exam.  No active cardiopulmonary process.

## 2014-05-25 ENCOUNTER — Encounter: Payer: Self-pay | Admitting: Pulmonary Disease

## 2014-05-25 ENCOUNTER — Ambulatory Visit (INDEPENDENT_AMBULATORY_CARE_PROVIDER_SITE_OTHER): Payer: BC Managed Care – PPO | Admitting: Pulmonary Disease

## 2014-05-25 ENCOUNTER — Ambulatory Visit (INDEPENDENT_AMBULATORY_CARE_PROVIDER_SITE_OTHER)
Admission: RE | Admit: 2014-05-25 | Discharge: 2014-05-25 | Disposition: A | Discharge status: Home / Self Care | Payer: BC Managed Care – PPO | Source: Ambulatory Visit | Attending: Pulmonary Disease | Admitting: Pulmonary Disease

## 2014-05-25 VITALS — BP 134/92 | HR 67 | Temp 97.6°F | Ht 74.0 in | Wt 303.8 lb

## 2014-05-25 DIAGNOSIS — J45909 Unspecified asthma, uncomplicated: Secondary | ICD-10-CM

## 2014-05-25 DIAGNOSIS — D869 Sarcoidosis, unspecified: Secondary | ICD-10-CM

## 2014-05-25 DIAGNOSIS — R05 Cough: Secondary | ICD-10-CM

## 2014-05-25 DIAGNOSIS — R059 Cough, unspecified: Secondary | ICD-10-CM

## 2014-05-25 DIAGNOSIS — J309 Allergic rhinitis, unspecified: Secondary | ICD-10-CM

## 2014-05-25 DIAGNOSIS — J4521 Mild intermittent asthma with (acute) exacerbation: Secondary | ICD-10-CM

## 2014-05-25 DIAGNOSIS — J45901 Unspecified asthma with (acute) exacerbation: Secondary | ICD-10-CM

## 2014-05-25 DIAGNOSIS — G4733 Obstructive sleep apnea (adult) (pediatric): Secondary | ICD-10-CM

## 2014-05-25 MED ORDER — FLUTICASONE FUROATE-VILANTEROL 100-25 MCG/INH IN AEPB: 1.0000 | INHALATION_SPRAY | Freq: Every day | RESPIRATORY_TRACT | Status: AC

## 2014-05-25 NOTE — Progress Notes (Signed)
Chief Complaint  Patient presents with  . Follow-up    Pt reports chest tightness x 1-2 weeks and SOB. Denies chest pain.     History of Present Illness: Rick Mejia is a 48 y.o. male with pulmonary sarcoidosis, mild/intermittent asthma, and OSA.  I last saw Rick Mejia in July 2013.   He was doing well until recently.  He has since developed chest tightness and dyspnea over the past two weeks.  He has been feeling more tired.  He has occasional cough.  He is not sure if he wheezes.  He denies fever, sweats, hemoptysis, rash, or swelling.  He has been getting sinus congestion.  He continues to snore.  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  He  has a past medical history of Allergic rhinitis; Asthma, mild intermittent; Pulmonary sarcoidosis; and Pneumonia (2000).  He  has past surgical history that includes Tonsillectomy; Hernia repair; and Bronchoscopy.  Current Outpatient Prescriptions on File Prior to Visit  Medication Sig Dispense Refill  . fluticasone (FLONASE) 50 MCG/ACT nasal spray Place 1 spray into the nose daily.         No current facility-administered medications on file prior to visit.   No Known Allergies  Physical Exam:  General - obese, nasal voice quality HEENT - no sinus tenderness, MP 4, retrognathic Cardiac - s1s2 regular Chest - no wheeze/rales Abdomen - soft, nontender Extremities - no edema Skin - no rashes Neurologic - normal strength Psychiatric - normal mood, behavior   Assessment/Plan: Coralyn HellingVineet Sood, MD [facility] Pulmonary/Critical Care 05/25/2014, 12:10 PM Pager:  [phone] After 3pm call: [phone]

## 2014-05-25 NOTE — Patient Instructions (Signed)
Breo one puff daily >> rinse mouth after each use Chest xray today Will arrange for breathing test (PFT) Follow up in 6 weeks

## 2014-05-26 ENCOUNTER — Telehealth: Payer: Self-pay | Admitting: Pulmonary Disease

## 2014-05-26 NOTE — Assessment & Plan Note (Signed)
He is to use flonase on regular basis for now.

## 2014-05-26 NOTE — Assessment & Plan Note (Signed)
Some of his symptoms of daytime fatigue could be related to OSA.  Will need to re-assess his willingness for OSA therapy at next visit.

## 2014-05-26 NOTE — Assessment & Plan Note (Signed)
I don't think his current symptoms are related to sarcoidosis recurrence.  Will get CXR to further assess.

## 2014-05-26 NOTE — Telephone Encounter (Signed)
Dg Chest 2 View  05/25/2014   CLINICAL DATA:  Cough, shortness of breath.  History of sarcoidosis.  EXAM: CHEST  2 VIEW  COMPARISON:  06/26/2012  FINDINGS: Linear areas of scarring in the left lung base, stable. Right lung is clear. Heart is normal size. Mediastinal contours within normal limits. No acute bony abnormality or effusions.  IMPRESSION: Left basilar scarring.  No active disease.   Electronically Signed   By: Charlett NoseKevin  Dover M.D.   On: 05/25/2014 13:55    Will have my nurse inform pt that CXR looks okay.  No evidence for recurrence of sarcoidosis.  No change to current treatment plan.

## 2014-05-26 NOTE — Telephone Encounter (Signed)
Results have been explained to patient, pt expressed understanding. Nothing further needed.  

## 2014-05-26 NOTE — Assessment & Plan Note (Addendum)
His current symptoms are more suggestive of asthma exacerbation likely related to allergies.  I don't think he needs prednisone or antibiotics at this time.  Will give him trial of Breo.  Will also arrange for repeat PFTs.

## 2014-07-13 ENCOUNTER — Encounter: Payer: Self-pay | Admitting: Pulmonary Disease

## 2014-07-13 ENCOUNTER — Ambulatory Visit (INDEPENDENT_AMBULATORY_CARE_PROVIDER_SITE_OTHER): Payer: BC Managed Care – PPO | Admitting: Pulmonary Disease

## 2014-07-13 VITALS — BP 132/90 | HR 75 | Temp 97.6°F | Ht 73.5 in | Wt 309.0 lb

## 2014-07-13 DIAGNOSIS — R059 Cough, unspecified: Secondary | ICD-10-CM

## 2014-07-13 DIAGNOSIS — D869 Sarcoidosis, unspecified: Secondary | ICD-10-CM

## 2014-07-13 DIAGNOSIS — J45909 Unspecified asthma, uncomplicated: Secondary | ICD-10-CM

## 2014-07-13 DIAGNOSIS — J309 Allergic rhinitis, unspecified: Secondary | ICD-10-CM

## 2014-07-13 DIAGNOSIS — G4733 Obstructive sleep apnea (adult) (pediatric): Secondary | ICD-10-CM

## 2014-07-13 DIAGNOSIS — J4520 Mild intermittent asthma, uncomplicated: Secondary | ICD-10-CM

## 2014-07-13 DIAGNOSIS — R05 Cough: Secondary | ICD-10-CM

## 2014-07-13 NOTE — Progress Notes (Signed)
PFT done today. 

## 2014-07-13 NOTE — Patient Instructions (Signed)
Follow up in 6 months 

## 2014-07-13 NOTE — Progress Notes (Signed)
Chief Complaint  Patient presents with  . Follow-up    Review PFT. No complaints with breathing.     History of Present Illness: Rick Mejia is a 48 y.o. male with pulmonary sarcoidosis, mild/intermittent asthma, and OSA.  His breathing has improved since starting breo.  He is not having cough, wheeze, chest pain, fever, or sputum.  He thinks his symptoms were triggered by allergies.  He still has trouble with his sleep.  He snores, and will fall asleep while watching TV.  TESTS: Bronchoscopy 05/05/09 >> multinucleated giant cells consistent with non necrotizing granulomas RAST 09/24/09 >> negative, IgE 10.8 PFT 01/14/10 >> FEV1 4.37(105%), FEV1% 64, TLC 9.48(120%), DLCO 125%, no BD PSG 06/23/11 [facility] >> RDI 19.1, supine RDI 20.1 HST 07/02/12 >> AHI 6.3, SpO2 low 81%. PFT 07/13/14 >> FEV1 4.25 (95%), FEV1% 69, TLC 9.41 (122%), DLCO 137%  PMHx, PSHx, Medications, Allergies, Fhx, Shx reviewed.  Physical Exam:  General - obese, nasal voice quality HEENT - no sinus tenderness, MP 4, retrognathic Cardiac - s1s2 regular Chest - no wheeze/rales Abdomen - soft, nontender Extremities - no edema Skin - no rashes Neurologic - normal strength Psychiatric - normal mood, behavior  Dg Chest 2 View  05/25/2014   CLINICAL DATA:  Cough, shortness of breath.  History of sarcoidosis.  EXAM: CHEST  2 VIEW  COMPARISON:  06/26/2012  FINDINGS: Linear areas of scarring in the left lung base, stable. Right lung is clear. Heart is normal size. Mediastinal contours within normal limits. No acute bony abnormality or effusions.  IMPRESSION: Left basilar scarring.  No active disease.   Electronically Signed   By: Charlett NoseKevin  Dover M.D.   On: 05/25/2014 13:55     Assessment/Plan: Rick HellingVineet Edrei Norgaard, MD [facility] Pulmonary/Critical Care 07/13/2014, 2:49 PM Pager:  [phone] After 3pm call: [phone]

## 2014-07-14 ENCOUNTER — Telehealth: Payer: Self-pay | Admitting: Pulmonary Disease

## 2014-07-14 NOTE — Telephone Encounter (Signed)
Pt recently seen by cardiology.  She has progressive dyspnea, and needs further pulmonary evaluation.  Will have my nurse schedule appointment with me or Tammy Parrett within next one week.

## 2014-07-14 NOTE — Telephone Encounter (Signed)
Previous message entered in error

## 2014-07-15 LAB — PULMONARY FUNCTION TEST
DL/VA % pred: 122 %
DL/VA: 5.92 ml/min/mmHg/L
DLCO unc % pred: 137 %
DLCO unc: 51.05 ml/min/mmHg
FEF 25-75 Post: 2.55 L/sec
FEF 25-75 Pre: 3.12 L/sec
FEF2575-%CHANGE-POST: -18 %
FEF2575-%PRED-POST: 65 %
FEF2575-%Pred-Pre: 79 %
FEV1-%CHANGE-POST: -5 %
FEV1-%PRED-POST: 90 %
FEV1-%Pred-Pre: 95 %
FEV1-PRE: 4.25 L
FEV1-Post: 4 L
FEV1FVC-%Change-Post: -7 %
FEV1FVC-%PRED-PRE: 88 %
FEV6-%Change-Post: -3 %
FEV6-%Pred-Post: 107 %
FEV6-%Pred-Pre: 111 %
FEV6-POST: 5.94 L
FEV6-PRE: 6.15 L
FEV6FVC-%Change-Post: -1 %
FEV6FVC-%PRED-PRE: 103 %
FEV6FVC-%Pred-Post: 101 %
FVC-%Change-Post: 1 %
FVC-%PRED-PRE: 107 %
FVC-%Pred-Post: 109 %
FVC-Post: 6.24 L
FVC-Pre: 6.15 L
POST FEV1/FVC RATIO: 64 %
POST FEV6/FVC RATIO: 98 %
PRE FEV6/FVC RATIO: 100 %
Pre FEV1/FVC ratio: 69 %
RV % PRED: 125 %
RV: 2.75 L
TLC % PRED: 122 %
TLC: 9.41 L

## 2014-07-15 NOTE — Assessment & Plan Note (Signed)
Recent symptoms likely related to asthma made worse by allergies.  [facility]  Explained that he could try d/c'ing Breo if he continues to feel well.

## 2014-07-15 NOTE — Assessment & Plan Note (Signed)
No evidence for recurrence.

## 2014-07-15 NOTE — Assessment & Plan Note (Signed)
He would like to try weight loss first.  If this is unsuccessful, then he would need repeat sleep study to determine current status of sleep apnea.

## 2014-07-15 NOTE — Assessment & Plan Note (Signed)
He can use flonase prn.

## 2015-10-30 IMAGING — CR DG CHEST 2V
2 series · 2 of 2 positions shown · non-contrast
Comparison: 06/26/2012

CLINICAL DATA: Cough, shortness of breath.  History of sarcoidosis.

EXAM:
CHEST  2 VIEW

[view not recorded (1 of 2)]
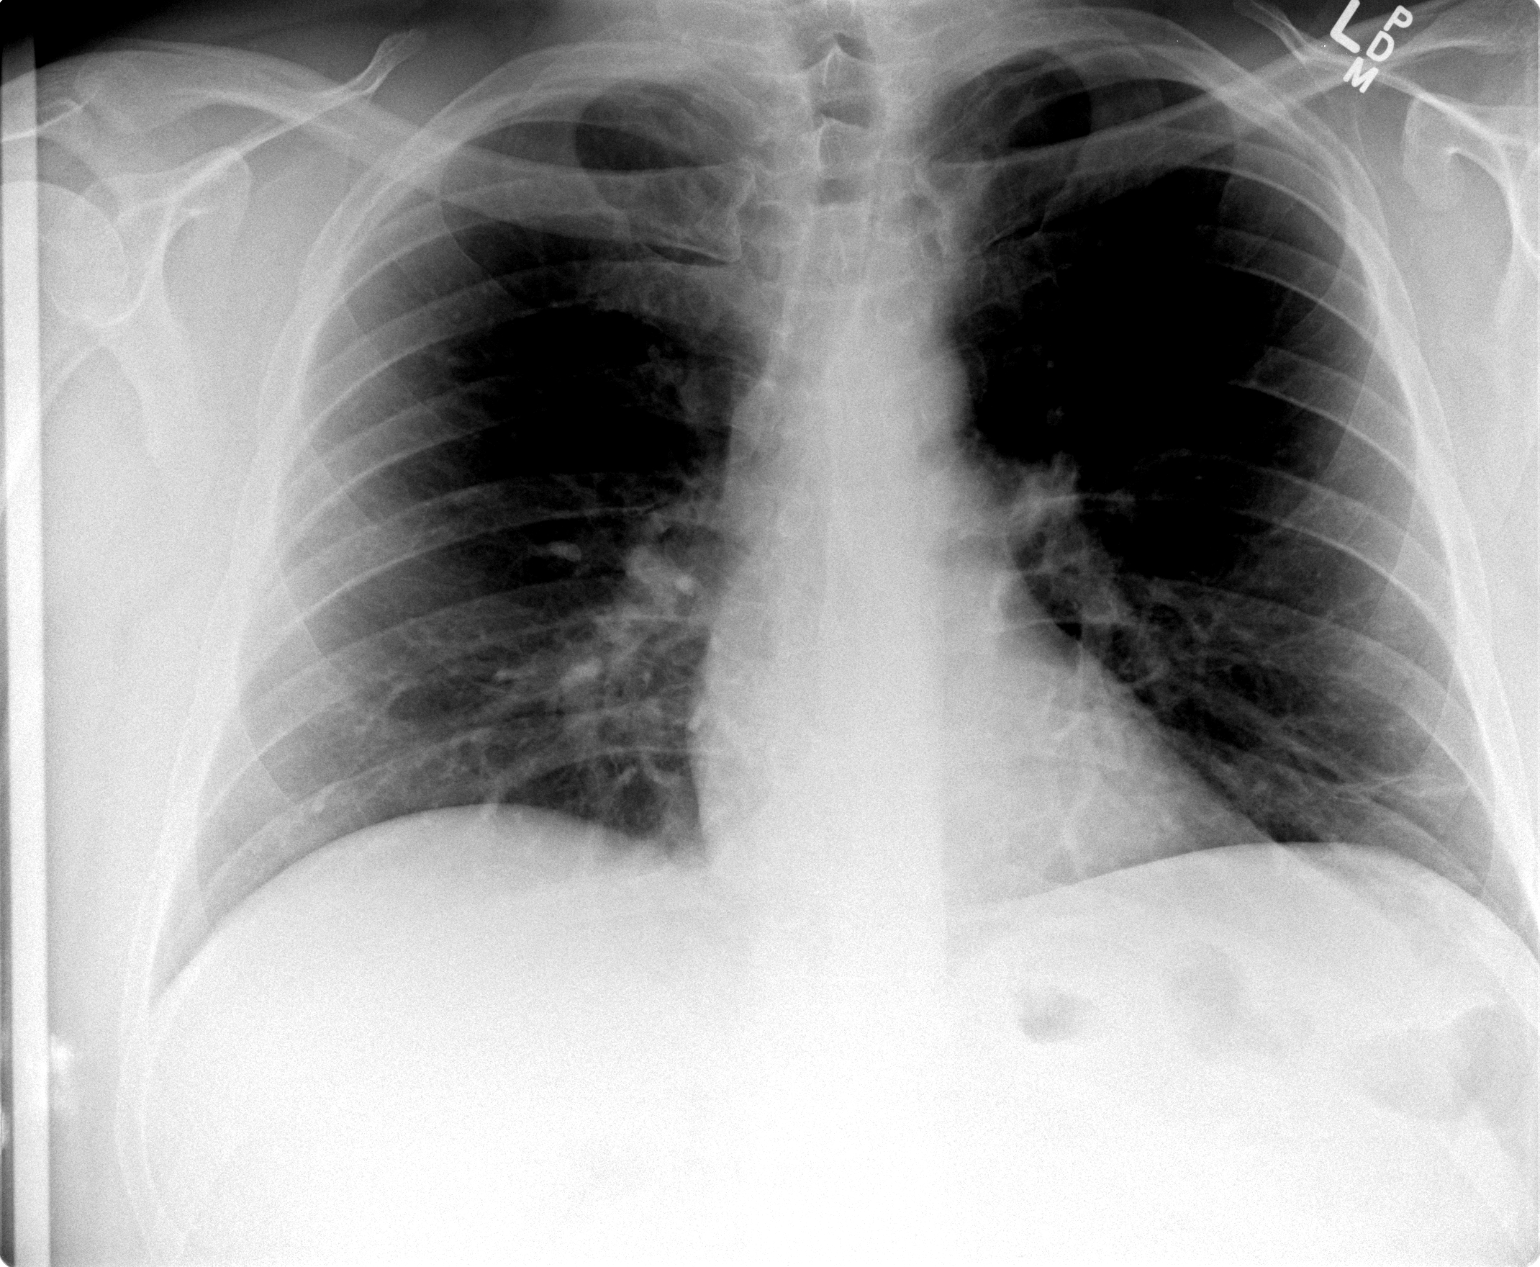

[view not recorded (2 of 2)]
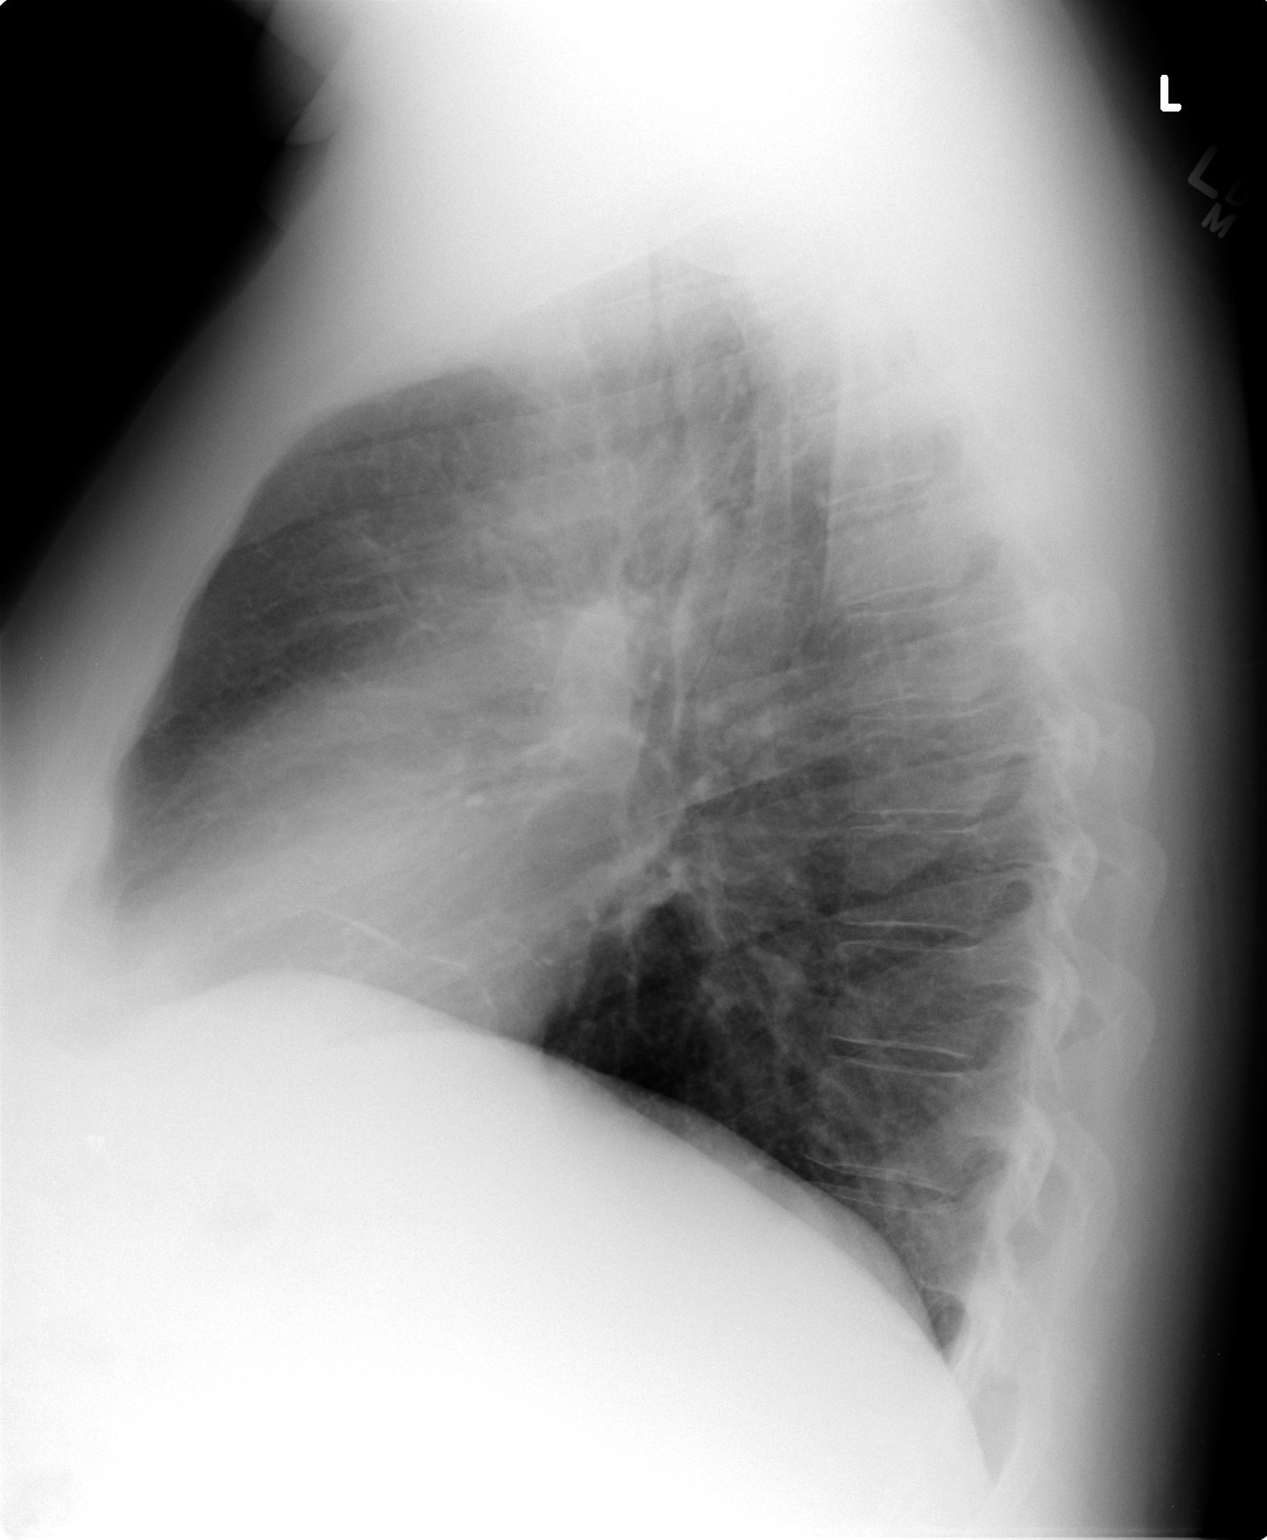

[2 of 2 positions shown; findings below may reference images not displayed]

FINDINGS: Linear areas of scarring in the left lung base, stable. Right lung
is clear. Heart is normal size. Mediastinal contours within normal
limits. No acute bony abnormality or effusions.
IMPRESSION: Left basilar scarring.  No active disease.

## 2020-06-23 ENCOUNTER — Telehealth: Payer: Self-pay | Admitting: Pulmonary Disease

## 2020-06-23 NOTE — Telephone Encounter (Signed)
I offered an earlier apt with Dr. Judeth Horn. Patient wife states that she would only like him to see dr. Craige Cotta and would have him keep consult with Dr. Craige Cotta. But would also make apt. With his other pulmonologist. Nothing further is needed.

## 2020-06-23 NOTE — Telephone Encounter (Signed)
Patient is currently very SOB, post covid(10/2019). Former [facility] patient, hasn't been seen since 2015, patient currently seen at [facility] for pulm for sleep. Wants to come back to Dr Craige Cotta, scheduled for 08/23/20. Patient's Emerg Contact wanted writer to see if [facility] would see him sooner. Explained he hadn't been seen since 2015 so he's a "new" patient. Please advise [phone]

## 2020-07-23 ENCOUNTER — Institutional Professional Consult (permissible substitution): Payer: Self-pay | Admitting: Pulmonary Disease

## 2020-08-23 ENCOUNTER — Ambulatory Visit: Payer: Self-pay | Admitting: Pulmonary Disease

## 2020-08-23 ENCOUNTER — Institutional Professional Consult (permissible substitution): Payer: Self-pay | Admitting: Pulmonary Disease

## 2020-09-05 ENCOUNTER — Other Ambulatory Visit (HOSPITAL_COMMUNITY): Payer: Self-pay | Admitting: NURSE PRACTITIONER

## 2020-09-05 ENCOUNTER — Ambulatory Visit
Admission: RE | Admit: 2020-09-05 | Discharge: 2020-09-05 | Disposition: A | Discharge status: Home / Self Care | Payer: Self-pay | Source: Ambulatory Visit | Attending: Nurse Practitioner | Admitting: Nurse Practitioner

## 2020-09-05 ENCOUNTER — Other Ambulatory Visit: Payer: Self-pay

## 2020-09-05 VITALS — BP 126/80 | HR 88 | Temp 99.2°F | Resp 18

## 2020-09-05 DIAGNOSIS — U071 COVID-19: Secondary | ICD-10-CM | POA: Insufficient documentation

## 2020-09-05 DIAGNOSIS — Z23 Encounter for immunization: Secondary | ICD-10-CM | POA: Insufficient documentation

## 2020-09-05 MED ORDER — SODIUM CHLORIDE 0.9 % INTRAVENOUS SOLUTION
1200.0000 mg | Freq: Once | INTRAVENOUS | Status: AC
Start: 2020-09-05 — End: 2020-09-05
  Administered 2020-09-05: 1200 mg via INTRAVENOUS
  Administered 2020-09-05: 0 mg via INTRAVENOUS
  Filled 2020-09-05: qty 5

## 2020-09-05 MED ORDER — ACETAMINOPHEN 325 MG TABLET
650.0000 mg | ORAL_TABLET | Freq: Once | ORAL | Status: AC
Start: 2020-09-05 — End: 2020-09-05
  Administered 2020-09-05: 650 mg via ORAL
  Filled 2020-09-05: qty 2

## 2020-09-05 MED ORDER — DIPHENHYDRAMINE 25 MG CAPSULE
50.0000 mg | ORAL_CAPSULE | Freq: Once | ORAL | Status: AC
Start: 2020-09-05 — End: 2020-09-05
  Administered 2020-09-05: 50 mg via ORAL
  Filled 2020-09-05: qty 2

## 2020-09-05 MED ORDER — SODIUM CHLORIDE 0.9% FLUSH BAG - 50 ML
INTRAVENOUS | Status: DC | PRN
Start: 2020-09-05 — End: 2020-09-06

## 2020-09-05 NOTE — Nurses Notes (Signed)
Patient ambulated to private vehicle in stable condition.

## 2020-09-05 NOTE — Nurses Notes (Signed)
This nurse charting on behalf of Stephanie McCoy, CEO and Betsy Sayre, CRNA; those listed above do not have access to DAR.

## 2020-09-06 ENCOUNTER — Encounter (HOSPITAL_COMMUNITY): Payer: Self-pay

## 2021-07-27 ENCOUNTER — Ambulatory Visit
Admission: RE | Admit: 2021-07-27 | Discharge: 2021-07-27 | Disposition: A | Discharge status: Home / Self Care | Payer: Self-pay | Source: Ambulatory Visit | Attending: INTERNAL MEDICINE | Admitting: INTERNAL MEDICINE

## 2021-07-27 ENCOUNTER — Other Ambulatory Visit (HOSPITAL_COMMUNITY): Payer: Self-pay

## 2021-07-27 ENCOUNTER — Encounter (HOSPITAL_COMMUNITY): Payer: Self-pay

## 2021-07-27 ENCOUNTER — Other Ambulatory Visit: Payer: Self-pay

## 2021-07-27 DIAGNOSIS — Z0289 Encounter for other administrative examinations: Secondary | ICD-10-CM | POA: Insufficient documentation

## 2021-07-28 ENCOUNTER — Encounter (HOSPITAL_COMMUNITY): Payer: Self-pay

## 2022-05-19 ENCOUNTER — Emergency Department
Admission: EM | Admit: 2022-05-19 | Discharge: 2022-05-19 | Discharge status: Against Medical Advice | Payer: No Typology Code available for payment source

## 2022-05-19 ENCOUNTER — Other Ambulatory Visit: Payer: Self-pay

## 2022-05-19 ENCOUNTER — Encounter (HOSPITAL_COMMUNITY): Payer: Self-pay

## 2022-05-19 DIAGNOSIS — Z5321 Procedure and treatment not carried out due to patient leaving prior to being seen by health care provider: Secondary | ICD-10-CM | POA: Insufficient documentation

## 2022-05-19 NOTE — ED Nurses Note (Signed)
Registration came back and stated he is going to monitor his blood pressure at home and will return if goes up tonight and will follow up in clinic tomorrow. Patient had already left when registration notified us of him leaving.

## 2022-05-19 NOTE — ED Triage Notes (Signed)
Bilateral knee swelling yesterday.  Also having high blood pressure.

## 2022-05-22 ENCOUNTER — Telehealth (INDEPENDENT_AMBULATORY_CARE_PROVIDER_SITE_OTHER): Payer: Self-pay | Admitting: Physician Assistant

## 2022-05-22 NOTE — Telephone Encounter (Signed)
Post Ed Follow-Up    Post ED Follow-Up:   Document completed and/or attempted interactive contact(s) after transition to home after emergency department stay.:   Transition Facility and relevant Date:   Discharge Date: 05/19/22  Discharge from [facility] Emergency Department?: Yes  Discharge Facility: [facility]  Contacted by: Dory Peru  Contact method: Patient/Caregiver Telephone  Contact first attempt: 05/22/2022  9:01 AM  MyChart message sent?: No  Left patient a message to return call.

## 2022-05-25 ENCOUNTER — Other Ambulatory Visit: Payer: Self-pay

## 2022-05-25 ENCOUNTER — Ambulatory Visit: Payer: BC Managed Care – PPO | Attending: Physician Assistant | Admitting: Physician Assistant

## 2022-05-25 ENCOUNTER — Encounter (INDEPENDENT_AMBULATORY_CARE_PROVIDER_SITE_OTHER): Payer: Self-pay | Admitting: Physician Assistant

## 2022-05-25 VITALS — BP 132/98 | HR 89 | Temp 97.6°F | Resp 17 | Ht 73.0 in | Wt 235.0 lb

## 2022-05-25 DIAGNOSIS — Z0000 Encounter for general adult medical examination without abnormal findings: Secondary | ICD-10-CM | POA: Insufficient documentation

## 2022-05-25 DIAGNOSIS — F411 Generalized anxiety disorder: Secondary | ICD-10-CM | POA: Insufficient documentation

## 2022-05-25 DIAGNOSIS — E669 Obesity, unspecified: Secondary | ICD-10-CM | POA: Insufficient documentation

## 2022-05-25 DIAGNOSIS — E538 Deficiency of other specified B group vitamins: Secondary | ICD-10-CM | POA: Insufficient documentation

## 2022-05-25 DIAGNOSIS — K047 Periapical abscess without sinus: Secondary | ICD-10-CM | POA: Insufficient documentation

## 2022-05-25 DIAGNOSIS — R600 Localized edema: Secondary | ICD-10-CM | POA: Insufficient documentation

## 2022-05-25 DIAGNOSIS — Z79899 Other long term (current) drug therapy: Secondary | ICD-10-CM | POA: Insufficient documentation

## 2022-05-25 DIAGNOSIS — Z6831 Body mass index (BMI) 31.0-31.9, adult: Secondary | ICD-10-CM | POA: Insufficient documentation

## 2022-05-25 DIAGNOSIS — Z713 Dietary counseling and surveillance: Secondary | ICD-10-CM | POA: Insufficient documentation

## 2022-05-25 DIAGNOSIS — I10 Essential (primary) hypertension: Secondary | ICD-10-CM | POA: Insufficient documentation

## 2022-05-25 MED ORDER — CYANOCOBALAMIN (VIT B-12) 1,000 MCG TABLET
1000.0000 ug | ORAL_TABLET | Freq: Every day | ORAL | 0 refills | Status: AC
Start: 2022-05-25 — End: ?

## 2022-05-25 NOTE — Nursing Note (Signed)
Body mass index is 31 kg/m.    Fall Risk Assessment  Do you feel unsteady when standing or walking?: Yes  Do you worry about falling?: No  Have you fallen in the past year?: No  Timed up and go test (in seconds): 9      PHQ Questionnaire  Little interest or pleasure in doing things.: Not at all  Feeling down, depressed, or hopeless: Several Days  PHQ 2 Total: 1  Trouble falling or staying asleep, or sleeping too much.: More than half the days  Feeling tired or having little energy: More than half the days  Poor appetite or overeating: Not at all  Feeling bad about yourself/ that you are a failure in the past 2 weeks?: Not at all  Trouble concentrating on things in the past 2 weeks?: Several Days  Moving/Speaking slowly or being fidgety or restless  in the past 2 weeks?: Not at all  Thoughts that you would be better off DEAD, or of hurting yourself in some way.: Not at all  If you checked off any problems, how difficult have these problems made it for you to do your work, take care of things at home, or get along with other people?: Not difficult at all  PHQ 9 Total: 6  Interpretation of Total Score: 5-9 Mild depression              05/25/2022     3:58 PM   GAD-7 Questionnaire   Feeling nervous,anxious,on edge 2   Not being able to stop or control worrying 1   Worrying too much about different things 1   Trouble relaxing 1   Being so restless that it is hard to sit still 0   Becoming easily annoyed or irritable 2   Feeling afraid as if something awful might happen 1   How difficult have these problems made it for you to work, take care of things at home, or get along with other people? Not difficult at all   Gad-7 Score Total 8   Interpretation 5-9, mild anxiety         Functional Health Screening:           Travel Screening     Question Response    In the last 10 days, have you been in contact with someone who was confirmed or suspected to have Coronavirus/COVID-19? No / Unsure    Have you had a COVID-19 viral test  in the last 10 days? No    Do you have any of the following new or worsening symptoms? None of these    Have you traveled internationally or domestically in the last month? No      Travel History   Travel since 04/24/22    No documented travel since 04/24/22         Bertis Ruddy, LPN  3/78/5885, 16:04

## 2022-05-25 NOTE — Nursing Note (Signed)
05/25/22 1559   Housing Stability   What is your housing situation? I have housi   Are you worried about losing your housing? No   Health Literacy   How often do you have a problem understanding what is told to you about your medical condition?  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  No   Transportation   Has lack of transportation kept you from medical appointments, meetings, work, or from getting things needed for daily living?  No   Social Connections   How often do you see or talk to people that you care about and feel close to? (For example: talking to friends on the phone, visiting friends or family, going to church or club meetings) 5 or more ti   Intimate Partner Violence   In the past year, have you been afraid of your partner or ex-partner? No   Do you feel physically safe and emotionally safe where you currently live? Yes   Help Needed   Would you like help with any of these needs? No   Are any of these needs urgent? No

## 2022-05-25 NOTE — Progress Notes (Signed)
FAMILY MEDICINE, [facility]  [address]  [place] [address]           Name: Terrence Mejia MRN:  [id number]   Date: 05/25/2022 Age: 56 y.o.        SUBJECTIVE    Patient is 56 y.o. he  presents today for   Chief Complaint   Patient presents with   . ED Follow-up     Patient presents today for follow up regarding ER visit 6/16. Patient states he went to the ER for elevated blood pressure and edema in lower extremity. Patient states he left AMA. He went Monday to medical through his work and was started on lisinopril, blood work done at that time and patient brought results today to visit.   . Edema     Patient states that swelling has improved.      Seen here over 3 years ago.     Seen at our ER 05/19/2022 with knee pain and swelling.  Left AMA.     As above.   Today patient states that he was having some swelling in the knees just the day he went to the ER on 05/19/2022 and in the right ankle.  No injury noted.  He notes that he was up an down ladders all the time. He had partial knee replacement at age 69 years old.  He says that has done well.  No grinding.  No pain in the knees. He has been eating salt lately.  He hadn't been checking his BP.  Was 160's in the ER systolic.  Hadn't been having a ringing as before. He had been off meds for 3-4 years ago.  He was started on BP meds 2 days ago.  Forgot to take it this am.  Felt like a "zombie" at first. Swelling has went down.     He has had some sob.  He had xray today at his work.   Has had a h/o pleural thickening in the past on xray's.   No cough.  Little wheeze noted.   Especially since covid 1 1/2 ago.   Asthma listed in chart but he was unaware. Had PFT's before and no reported problem.     No reported cp, lightheadedness.      Patient appears to be tolerating meds fine.     Other chronic conditions remain stable.      Last labs: in the past week.     ROS:  Systems otherwise negative than what has been  noted above in the HPI.   PMH, surgical, FH, Social Hx, allergies, meds, all reviewed and up dated today.     Nursing Notes:   Bertis Ruddy, LPN  67/67/20 9470  Signed  Body mass index is 31 kg/m.    Fall Risk Assessment  Do you feel unsteady when standing or walking?: Yes  Do you worry about falling?: No  Have you fallen in the past year?: No  Timed up and go test (in seconds): 9      PHQ Questionnaire  Little interest or pleasure in doing things.: Not at all  Feeling down, depressed, or hopeless: Several Days  PHQ 2 Total: 1  Trouble falling or staying asleep, or sleeping too much.: More than half the days  Feeling tired or having little energy: More than half the days  Poor appetite or overeating: Not at all  Feeling bad about yourself/ that you are a failure in the  past 2 weeks?: Not at all  Trouble concentrating on things in the past 2 weeks?: Several Days  Moving/Speaking slowly or being fidgety or restless  in the past 2 weeks?: Not at all  Thoughts that you would be better off DEAD, or of hurting yourself in some way.: Not at all  If you checked off any problems, how difficult have these problems made it for you to do your work, take care of things at home, or get along with other people?: Not difficult at all  PHQ 9 Total: 6  Interpretation of Total Score: 5-9 Mild depression              05/25/2022     3:58 PM   GAD-7 Questionnaire   Feeling nervous,anxious,on edge 2   Not being able to stop or control worrying 1   Worrying too much about different things 1   Trouble relaxing 1   Being so restless that it is hard to sit still 0   Becoming easily annoyed or irritable 2   Feeling afraid as if something awful might happen 1   How difficult have these problems made it for you to work, take care of things at home, or get along with other people? Not difficult at all   Gad-7 Score Total 8   Interpretation 5-9, mild anxiety         Functional Health Screening:           Travel Screening     Question Response     In the last 10 days, have you been in contact with someone who was confirmed or suspected to have Coronavirus/COVID-19? No / Unsure    Have you had a COVID-19 viral test in the last 10 days? No    Do you have any of the following new or worsening symptoms? None of these    Have you traveled internationally or domestically in the last month? No      Travel History   Travel since 04/24/22    No documented travel since 04/24/22         Bertis Ruddy, LPN  08/28/4627, 16:04      Bertis Ruddy, LPN  63/81/77 1165  Signed     05/25/22 1559   Housing Stability   What is your housing situation? I have housi   Are you worried about losing your housing? No   Health Literacy   How often do you have a problem understanding what is told to you about your medical condition?  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  No   Transportation   Has lack of transportation kept you from medical appointments, meetings, work, or from getting things needed for daily living?  No   Social Connections   How often do you see or talk to people that you care about and feel close to? (For example: talking to friends on the phone, visiting friends or family, going to church or club meetings) 5 or more ti   Intimate Partner Violence   In the past year, have you been afraid of your partner or ex-partner? No   Do you feel physically safe and emotionally safe where you currently live? Yes   Help Needed   Would you like help with  any of these needs? No   Are any of these needs urgent? No           OBJECTIVE    BP (!) 132/98   Pulse 89   Temp 36.4 C (97.6 F)   Resp 17   Ht 1.854 m (6\' 1" )   Wt 107 kg (235 lb)   SpO2 97%   BMI 31.00 kg/m       General: Pleasant, WNWD, NAD, AAO  Head: Normocephalic, AT,no lesions.   Eyes:  conjunctiva clear. PERRL   Ears: external ear  lobes without d/c, swelling, lesions.    Nose:  no rhinnorhea noted, no lesions  Mouth:  Dental decay noted.  no lesions.  MMM.    Neck: Supple, no masses, no thyromegaly, no bruits.   Chest: Lungs clear, no rales, no rhonchi, no wheezes.   Heart: RRR, no murmurs, no rubs, no gallops.  No clicks..   Abdomen: Soft, no tenderness, no masses, BS normal.  No r/g.  No organomegally.   Back: No pain reported on ROM.    Extremities: Normal gait, no deformities, no edema.    Neuro: Physiological, no localizing findings.   Skin:  No rashes, no lesions noted.    Psych: Mood and Affect normal      PAST MEDICAL HISTORY    Current Outpatient Medications   Medication Sig   . clindamycin (CLEOCIN) 300 mg Oral Capsule 1 Capsule (300 mg total)   . cloNIDine HCL (CATAPRES) 0.1 mg Oral Tablet 1 Tablet (0.1 mg total)   . cyanocobalamin (VITAMIN B 12) 1,000 mcg Oral Tablet Take 1 Tablet (1,000 mcg total) by mouth Once a day   . lisinopriL (PRINIVIL) 20 mg Oral Tablet take 1 tablet by oral route  every day Oral     No Known Allergies  Past Medical History:   Diagnosis Date   . Asthma    . Hypertension    . Migraine          Past Surgical History:   Procedure Laterality Date   . REPLACEMENT TOTAL KNEE BILATERAL Bilateral 2007         Family Medical History:    None         Social History     Socioeconomic History   . Marital status: Married   Tobacco Use   . Smoking status: Never   . Smokeless tobacco: Never   Vaping Use   . Vaping Use: Never used   Substance and Sexual Activity   . Alcohol use: Never   . Drug use: Never     Social Determinants of Health     Financial Resource Strain: Low Risk    . SDOH Financial: No   Transportation Needs: Low Risk    . SDOH Transportation: No   Social Connections: Low Risk    . SDOH Social Isolation: 5 or more times a week   Intimate Partner Violence: Low Risk    . SDOH Domestic Violence: No   Housing Stability: Low Risk    . SDOH Housing Situation: I have housing.   2008 SDOH Housing Worry: No          ASSESSMENT    ICD-10-CM    1. Encounter for medical examination to establish care  Z00.00       2. Essential hypertension  I10 BASIC METABOLIC PANEL      3. Bilateral lower extremity edema  R60.0       4. Vitamin B12 deficiency  E53.8       5. GAD (generalized anxiety disorder)  F41.1       6. Obesity (BMI 30.0-34.9)  E66.9       7. Nutritional counseling  Z71.3       8. Dental infection  K04.7           PLANS  Vs noted.  BP up.     Labs ordered for today: .  Continue to low blood pressure.  BNP upon next visit.      Get xray from his work on the chest.  Already completed.     Cont lisinopril.  Blood pressure seems to be improving.  This is medications today      Labs reviewed with the patient today from his work.    Replace vitamin B12.    Obesity:  To work weight loss.  Patient has done it before and I have asked him to get back on his diet and activity.    We are going to get his old records from his work and look and see what they put him on for anxiety before that did not work.  Once we have these hand we can make further decisions.    Complete out the current antibiotics.  Medication Orders   Medications   . cyanocobalamin (VITAMIN B 12) 1,000 mcg Oral Tablet     Sig: Take 1 Tablet (1,000 mcg total) by mouth Once a day     Dispense:  30 Tablet     Refill:  0           Records reviewed today:  Pt was educated/ counseled on the decisions made today with their involvement in these plans.  Active listening with pertinent questions asked today.   All questions answered and pt/family voice good understanding.     Immunizations:  (see nursing chief complaint documentation)   Preventive:    Diet and exercise reviewed  See dentist and eye doctor with regular scheduled visits.      FOLLOW UP:  Return in about 3 weeks (around 06/15/2022) for f/u htn, anxiety. .    Patient can return sooner if needed.      Total time spent with patient visit:  30 minutes.     The patient/care give was given ample opportunity to ask  questions and those questions were answered to his/her satisfaction. A good faith effort was made to reconcile the patient's medications. The patient/caregiver was counseled on any appropriate vaccinations by the provider and questions were answered. The patient/care giver was told to contact me with any additional questions or concerns, or go to the ED in an emergency.   This note may have been partially generated using M-Modal Fluency Direct system, and there may be some incorrect words, spellings, and punctuation that were not noted in checking the note before saving.    Annamarie Dawley, PA-C. 05/25/22 17:32  [facility]  [facility]  [address]  [place], [place] [address]  Phone [phone]

## 2022-06-20 ENCOUNTER — Ambulatory Visit: Payer: BC Managed Care – PPO | Attending: Physician Assistant | Admitting: Physician Assistant

## 2022-06-20 ENCOUNTER — Other Ambulatory Visit: Payer: Self-pay

## 2022-06-20 ENCOUNTER — Encounter (INDEPENDENT_AMBULATORY_CARE_PROVIDER_SITE_OTHER): Payer: Self-pay | Admitting: Physician Assistant

## 2022-06-20 VITALS — BP 132/90 | HR 67 | Temp 97.6°F | Resp 17 | Ht 73.0 in | Wt 236.0 lb

## 2022-06-20 DIAGNOSIS — E669 Obesity, unspecified: Secondary | ICD-10-CM | POA: Insufficient documentation

## 2022-06-20 DIAGNOSIS — F32A Depression, unspecified: Secondary | ICD-10-CM | POA: Insufficient documentation

## 2022-06-20 DIAGNOSIS — Z79899 Other long term (current) drug therapy: Secondary | ICD-10-CM | POA: Insufficient documentation

## 2022-06-20 DIAGNOSIS — E538 Deficiency of other specified B group vitamins: Secondary | ICD-10-CM | POA: Insufficient documentation

## 2022-06-20 DIAGNOSIS — Z6831 Body mass index (BMI) 31.0-31.9, adult: Secondary | ICD-10-CM | POA: Insufficient documentation

## 2022-06-20 DIAGNOSIS — F411 Generalized anxiety disorder: Secondary | ICD-10-CM | POA: Insufficient documentation

## 2022-06-20 DIAGNOSIS — I10 Essential (primary) hypertension: Secondary | ICD-10-CM | POA: Insufficient documentation

## 2022-06-20 MED ORDER — FLUOXETINE 20 MG CAPSULE
20.0000 mg | ORAL_CAPSULE | Freq: Every day | ORAL | 0 refills | Status: DC
Start: 2022-06-20 — End: 2022-07-19

## 2022-06-20 MED ORDER — LISINOPRIL 30 MG TABLET
30.0000 mg | ORAL_TABLET | Freq: Every day | ORAL | 4 refills | Status: DC
Start: 2022-06-20 — End: 2022-09-19

## 2022-06-20 NOTE — Progress Notes (Signed)
FAMILY MEDICINE, [facility]  [address]  [place] [address]           Name: Terrence Mejia MRN:  [id number]   Date: 06/20/2022 Age: 56 y.o.        SUBJECTIVE    Patient is 56 y.o. he  presents today for   Chief Complaint   Patient presents with   . Hypertension     Patient presents today for follow up hypertension. Patient states he does miss some doses of his lisinopril. He does not check his home BP.   Marland Kitchen Anxiety     Patient states anxiety is stable.        LOV:  05/25/2022 pt seen for HTN. Vs noted.  BP up.     Labs ordered for today: .  Continue to low blood pressure.  BNP upon next visit.      Get xray from his work on the chest.  Already completed.     Cont lisinopril.  Blood pressure seems to be improving.  This is medications today      Labs reviewed with the patient today from his work.    Replace vitamin B12.    Obesity:  To work weight loss.  Patient has done it before and I have asked him to get back on his diet and activity.    We are going to get his old records from his work and look and see what they put him on for anxiety before that did not work.  Once we have these hand we can make further decisions.    As above.   Today patient states that he is doing ok.    He notes that he hasn't taken BP at home.    Says he feels fine on the BP med.      Did take b12.     No reported cp, sob, lightheadedness, swelling.     Patient appears to be tolerating meds fine.     Other chronic conditions remain stable.      Last labs: at his work.     ROS:  Systems otherwise negative than what has been noted above in the HPI.   PMH, surgical, FH, Social Hx, allergies, meds, all reviewed and up dated today.     Nursing Notes:   Bertis Ruddy, LPN  59/97/74 1423  Signed  Body mass index is 31.14 kg/m.    Fall Risk Assessment  Do you feel unsteady when standing or walking?: No  Do you worry about falling?: No  Have you fallen in the past year?:  No      PHQ Questionnaire  Little interest or pleasure in doing things.: Several Days  Feeling down, depressed, or hopeless: Several Days  PHQ 2 Total: 2  Trouble falling or staying asleep, or sleeping too much.: More than half the days  Feeling tired or having little energy: More than half the days  Poor appetite or overeating: Several Days  Feeling bad about yourself/ that you are a failure in the past 2 weeks?: Several Days  Trouble concentrating on things in the past 2 weeks?: Several Days  Moving/Speaking slowly or being fidgety or restless  in the past 2 weeks?: More than half the days  Thoughts that you would be better off DEAD, or of hurting yourself in some way.: Not at all  If you checked off any problems, how difficult have these problems made it for you to  do your work, take care of things at home, or get along with other people?: Not difficult at all  PHQ 9 Total: 11  Interpretation of Total Score: 10-14 Moderate depression              05/25/2022     3:58 PM 06/20/2022     4:01 PM   GAD-7 Questionnaire   Feeling nervous,anxious,on edge 2 2   Not being able to stop or control worrying 1 2   Worrying too much about different things 1 2   Trouble relaxing 1 2   Being so restless that it is hard to sit still 0 2   Becoming easily annoyed or irritable 2 2   Feeling afraid as if something awful might happen 1 1   How difficult have these problems made it for you to work, take care of things at home, or get along with other people? Not difficult at all Not difficult at all   Gad-7 Score Total 8 13   Interpretation 5-9, mild anxiety 10-14, moderate anxiety         Functional Health Screening:           Travel Screening     Question Response    In the last 10 days, have you been in contact with someone who was confirmed or suspected to have Coronavirus/COVID-19? No / Unsure    Have you had a COVID-19 viral test in the last 10 days? No    Do you have any of the following new or worsening symptoms? None of these     Have you traveled internationally or domestically in the last month? No      Travel History   Travel since 05/21/22    No documented travel since 05/21/22         Bertis Ruddy, LPN  01/05/5426, 16:05        OBJECTIVE    BP (!) 132/90   Pulse 67   Temp 36.4 C (97.6 F)   Resp 17   Ht 1.854 m (6\' 1" )   Wt 107 kg (236 lb)   SpO2 96%   BMI 31.14 kg/m         General: Pleasant, WNWD, NAD, AAO  Head: Normocephalic, AT,no lesions.   Eyes:  no scleral icterus or injection.  Ears: external ear lobes without d/c, swelling, lesions.    Neck: No obvious JVD.   Chest: Lungs clear, no rales, no rhonchi, no wheezes.   Heart: RRR, no murmurs, no rubs, no gallops.  No clicks..    Back: No pain reported on ROM.    Extremities: Normal gait, no deformities, no edema.    Neuro: Physiological, no localizing findings.   Skin:  No rashes, no lesions noted.    Psych: Mood and Affect normal      PAST MEDICAL HISTORY    Current Outpatient Medications   Medication Sig   . cloNIDine HCL (CATAPRES) 0.1 mg Oral Tablet 1 Tablet (0.1 mg total)   . cyanocobalamin (VITAMIN B 12) 1,000 mcg Oral Tablet Take 1 Tablet (1,000 mcg total) by mouth Once a day   . FLUoxetine (PROZAC) 20 mg Oral Capsule Take 1 Capsule (20 mg total) by mouth Once a day   . Lisinopril (PRINIVIL) 30 mg Oral Tablet Take 1 Tablet (30 mg total) by mouth Once a day     No Known Allergies  Past Medical History:   Diagnosis Date   . Asthma    .  Hypertension    . Migraine          Past Surgical History:   Procedure Laterality Date   . REPLACEMENT TOTAL KNEE BILATERAL Bilateral 2007         Family Medical History:    None         Social History     Socioeconomic History   . Marital status: Married   Tobacco Use   . Smoking status: Never   . Smokeless tobacco: Never   Vaping Use   . Vaping Use: Never used   Substance and Sexual Activity   . Alcohol use: Never   . Drug use: Never     Social Determinants of Health     Financial Resource Strain: Low Risk    . SDOH Financial: No    Transportation Needs: Low Risk    . SDOH Transportation: No   Social Connections: Low Risk    . SDOH Social Isolation: 5 or more times a week   Intimate Partner Violence: Low Risk    . SDOH Domestic Violence: No   Housing Stability: Low Risk    . SDOH Housing Situation: I have housing.   Marland Kitchen SDOH Housing Worry: No         ASSESSMENT    ICD-10-CM    1. Vitamin B12 deficiency  E53.8 VITAMIN B12      2. Essential hypertension  I10 BASIC METABOLIC PANEL      3. GAD (generalized anxiety disorder)  F41.1       4. Obesity (BMI 30.0-34.9)  E66.9           PLANS    Vss.  Wt up 1lb.  BP up a little.     Start Prozac daily for anxiety.     Increase lisinopril to 30mg  qday.  Monitor at home.  Low salt diet.     Recheck b12 and bmp in 2 weeks.     Counseled some with pt today about anxiety.     Medication Orders   Medications   . Lisinopril (PRINIVIL) 30 mg Oral Tablet     Sig: Take 1 Tablet (30 mg total) by mouth Once a day     Dispense:  90 Tablet     Refill:  4   . FLUoxetine (PROZAC) 20 mg Oral Capsule     Sig: Take 1 Capsule (20 mg total) by mouth Once a day     Dispense:  30 Capsule     Refill:  0           Records reviewed today:  Pt was educated/ counseled on the decisions made today with their involvement in these plans.  Active listening with pertinent questions asked today.   All questions answered and pt/family voice good understanding.     Immunizations:  (see nursing documentation)   Preventive:    Eat a well balanced, healthy diet with routine exercise.         FOLLOW UP:  Return in about 4 weeks (around 07/18/2022) for f/u htn, labs. .    Patient can return sooner if needed.      Total time spent with patient visit:  20 minutes.     The patient/care give was given ample opportunity to ask questions and those questions were answered to his/her satisfaction. A good faith effort was made to reconcile the patient's medications. The patient/caregiver was counseled on any appropriate vaccinations by the provider and  questions were answered. The patient/care giver  was told to contact me with any additional questions or concerns, or go to the ED in an emergency.   This note may have been partially generated using M-Modal Fluency Direct system, and there may be some incorrect words, spellings, and punctuation that were not noted in checking the note before saving.    Annamarie Dawley, PA-C. 06/20/22 16:45  [facility]  [facility]  [address]  [place], [place] [address]  Phone [phone]

## 2022-06-20 NOTE — Nursing Note (Signed)
Body mass index is 31.14 kg/m.    Fall Risk Assessment  Do you feel unsteady when standing or walking?: No  Do you worry about falling?: No  Have you fallen in the past year?: No      PHQ Questionnaire  Little interest or pleasure in doing things.: Several Days  Feeling down, depressed, or hopeless: Several Days  PHQ 2 Total: 2  Trouble falling or staying asleep, or sleeping too much.: More than half the days  Feeling tired or having little energy: More than half the days  Poor appetite or overeating: Several Days  Feeling bad about yourself/ that you are a failure in the past 2 weeks?: Several Days  Trouble concentrating on things in the past 2 weeks?: Several Days  Moving/Speaking slowly or being fidgety or restless  in the past 2 weeks?: More than half the days  Thoughts that you would be better off DEAD, or of hurting yourself in some way.: Not at all  If you checked off any problems, how difficult have these problems made it for you to do your work, take care of things at home, or get along with other people?: Not difficult at all  PHQ 9 Total: 11  Interpretation of Total Score: 10-14 Moderate depression              05/25/2022     3:58 PM 06/20/2022     4:01 PM   GAD-7 Questionnaire   Feeling nervous,anxious,on edge 2 2   Not being able to stop or control worrying 1 2   Worrying too much about different things 1 2   Trouble relaxing 1 2   Being so restless that it is hard to sit still 0 2   Becoming easily annoyed or irritable 2 2   Feeling afraid as if something awful might happen 1 1   How difficult have these problems made it for you to work, take care of things at home, or get along with other people? Not difficult at all Not difficult at all   Gad-7 Score Total 8 13   Interpretation 5-9, mild anxiety 10-14, moderate anxiety         Functional Health Screening:           Travel Screening     Question Response    In the last 10 days, have you been in contact with someone who was confirmed or suspected to  have Coronavirus/COVID-19? No / Unsure    Have you had a COVID-19 viral test in the last 10 days? No    Do you have any of the following new or worsening symptoms? None of these    Have you traveled internationally or domestically in the last month? No      Travel History   Travel since 05/21/22    No documented travel since 05/21/22         Bertis Ruddy, LPN  05/27/7627, 16:05

## 2022-06-28 ENCOUNTER — Other Ambulatory Visit (INDEPENDENT_AMBULATORY_CARE_PROVIDER_SITE_OTHER): Payer: Self-pay

## 2022-06-28 DIAGNOSIS — I10 Essential (primary) hypertension: Secondary | ICD-10-CM

## 2022-06-28 DIAGNOSIS — E538 Deficiency of other specified B group vitamins: Secondary | ICD-10-CM

## 2022-07-13 ENCOUNTER — Telehealth (INDEPENDENT_AMBULATORY_CARE_PROVIDER_SITE_OTHER): Payer: Self-pay | Admitting: Physician Assistant

## 2022-07-13 NOTE — Telephone Encounter (Signed)
-----   Message from Annamarie Dawley, PA-C sent at 07/12/2022  8:11 AM EDT -----  Ms. Baird Lyons, can you please let the patient know that I have seen his latest blood work.  His blood work looks good.  Thank you!

## 2022-07-13 NOTE — Telephone Encounter (Signed)
Patient notified of results.  Yolani Vo, MA

## 2022-07-19 ENCOUNTER — Other Ambulatory Visit: Payer: Self-pay

## 2022-07-19 ENCOUNTER — Encounter (INDEPENDENT_AMBULATORY_CARE_PROVIDER_SITE_OTHER): Payer: Self-pay | Admitting: Physician Assistant

## 2022-07-19 ENCOUNTER — Ambulatory Visit: Payer: BC Managed Care – PPO | Attending: Physician Assistant | Admitting: Physician Assistant

## 2022-07-19 VITALS — BP 125/80 | HR 68 | Temp 97.6°F | Resp 17 | Ht 73.0 in | Wt 236.6 lb

## 2022-07-19 DIAGNOSIS — E538 Deficiency of other specified B group vitamins: Secondary | ICD-10-CM | POA: Insufficient documentation

## 2022-07-19 DIAGNOSIS — E669 Obesity, unspecified: Secondary | ICD-10-CM | POA: Insufficient documentation

## 2022-07-19 DIAGNOSIS — F411 Generalized anxiety disorder: Secondary | ICD-10-CM | POA: Insufficient documentation

## 2022-07-19 DIAGNOSIS — Z6831 Body mass index (BMI) 31.0-31.9, adult: Secondary | ICD-10-CM | POA: Insufficient documentation

## 2022-07-19 DIAGNOSIS — I10 Essential (primary) hypertension: Secondary | ICD-10-CM | POA: Insufficient documentation

## 2022-07-19 DIAGNOSIS — Z79899 Other long term (current) drug therapy: Secondary | ICD-10-CM | POA: Insufficient documentation

## 2022-07-19 MED ORDER — HYDROXYZINE HCL 25 MG TABLET
25.0000 mg | ORAL_TABLET | Freq: Three times a day (TID) | ORAL | 0 refills | Status: DC | PRN
Start: 2022-07-19 — End: 2022-08-22

## 2022-07-19 MED ORDER — FLUOXETINE 40 MG CAPSULE
40.0000 mg | ORAL_CAPSULE | Freq: Every day | ORAL | 0 refills | Status: DC
Start: 2022-07-19 — End: 2022-08-22

## 2022-07-19 NOTE — Progress Notes (Signed)
FAMILY MEDICINE, [facility]  [address]  [place] [address]           Name: Terrence Mejia MRN:  [id number]   Date: 07/19/2022 Age: 56 y.o.        SUBJECTIVE    Patient is 56 y.o. he  presents today for   Chief Complaint   Patient presents with    Hypertension     Patient presents today for follow up HTN.  Lisinopril was increased last apt.     Lab Results     Patient presents for lab review. Labs in chart.    Anxiety     Patient started on Prozac  last apt. Patient states he doesn't really feel any different.     Congestion     Patient complains of chest congestion. He states they tore down a room at work and could be from dust.         LOV:  06/20/2022.  Start Prozac daily for anxiety.  Increase lisinopril to 30mg  daily.  Recheck b12 and bmp in 2 weeks.  Counseled some with pt about anxiety.     As above.   Today patient states that he was tearing out an old room at his work and had a lot of dust exposure.  He has been feeling congested 3-4 days.   Does fever or chest.      No reported cp, sob, lightheadedness, swelling.     Patient appears to be tolerating meds fine.     Other chronic conditions remain stable.      Last labs: in the past mo.     ROS:  Systems otherwise negative than what has been noted above in the HPI.   PMH, surgical, FH, Social Hx, allergies, meds, all reviewed and up dated today.     Nursing Notes:   , LPN  Bertis Ruddy 46/96/29  Signed  Body mass index is 31.22 kg/m.    Fall Risk Assessment  Do you feel unsteady when standing or walking?: Yes  Do you worry about falling?: No  Have you fallen in the past year?: No  Timed up and go test (in seconds): 11      PHQ Questionnaire  Little interest or pleasure in doing things.: Several Days  Feeling down, depressed, or hopeless: Several Days  PHQ 2 Total: 2  Trouble falling or staying asleep, or sleeping too much.: Several Days  Feeling tired or having little energy: More than half  the days  Poor appetite or overeating: Not at all  Feeling bad about yourself/ that you are a failure in the past 2 weeks?: Several Days  Trouble concentrating on things in the past 2 weeks?: Several Days  Moving/Speaking slowly or being fidgety or restless  in the past 2 weeks?: Not at all  Thoughts that you would be better off DEAD, or of hurting yourself in some way.: Not at all  If you checked off any problems, how difficult have these problems made it for you to do your work, take care of things at home, or get along with other people?: Not difficult at all  PHQ 9 Total: 7  Interpretation of Total Score: 5-9 Mild depression              05/25/2022     3:58 PM 06/20/2022     4:01 PM 07/19/2022     3:53 PM   GAD-7 Questionnaire   Feeling nervous,anxious,on edge  2 2 2    Not being able to stop or control worrying 1 2 2    Worrying too much about different things 1 2 2    Trouble relaxing 1 2 2    Being so restless that it is hard to sit still 0 2 1   Becoming easily annoyed or irritable 2 2 2    Feeling afraid as if something awful might happen 1 1 2    How difficult have these problems made it for you to work, take care of things at home, or get along with other people? Not difficult at all Not difficult at all Not difficult at all   Gad-7 Score Total 8 13 13    Interpretation 5-9, mild anxiety 10-14, moderate anxiety 10-14, moderate anxiety         Functional Health Screening:           Travel Screening       Question Response    Have you been in contact with someone who was sick? No / Unsure    Do you have any of the following new or worsening symptoms? None of these    Have you traveled internationally or domestically in the last month? No          Travel History   Travel since 06/18/22    No documented travel since 06/18/22         , LPN  , 15:56      OBJECTIVE    BP 125/80   Pulse 68   Temp 36.4 C (97.6 F)   Resp 17   Ht 1.854 m (6\' 1" )   Wt 107 kg (236 lb 9.6 oz)   SpO2 94%   BMI 31.22  kg/m         General: Pleasant, WNWD, NAD, AAO  Head: Normocephalic, AT,no lesions.   Eyes:  no scleral icterus or injection.  Ears: external ear lobes without d/c, swelling, lesions.    Neck: No obvious JVD.   Chest: Lungs clear, no rales, no rhonchi, no wheezes.   Heart: RRR, no murmurs, no rubs, no gallops.  No clicks..    Back: No pain reported on ROM.    Extremities: Normal gait, no deformities, no edema.    Neuro: Physiological, no localizing findings.   Skin:  No rashes, no lesions noted.    Psych: Mood and Affect normal      PAST MEDICAL HISTORY    Current Outpatient Medications   Medication Sig    cloNIDine HCL (CATAPRES) 0.1 mg Oral Tablet 1 Tablet (0.1 mg total)    cyanocobalamin (VITAMIN B 12) 1,000 mcg Oral Tablet Take 1 Tablet (1,000 mcg total) by mouth Once a day    FLUoxetine (PROZAC) 40 mg Oral Capsule Take 1 Capsule (40 mg total) by mouth Once a day    hydrOXYzine HCL (ATARAX) 25 mg Oral Tablet Take 1 Tablet (25 mg total) by mouth Every 8 hours as needed for Anxiety    Lisinopril (PRINIVIL) 30 mg Oral Tablet Take 1 Tablet (30 mg total) by mouth Once a day     No Known Allergies  Past Medical History:   Diagnosis Date    Asthma     Hypertension     Migraine          Past Surgical History:   Procedure Laterality Date    REPLACEMENT TOTAL KNEE BILATERAL Bilateral 2007         Family Medical History:    None  Social History     Socioeconomic History    Marital status: Married   Tobacco Use    Smoking status: Never    Smokeless tobacco: Never   Vaping Use    Vaping Use: Never used   Substance and Sexual Activity    Alcohol use: Never    Drug use: Never     Social Determinants of Psychologist, prison and probation services Strain: Low Risk     SDOH Financial: No   Transportation Needs: Low Risk     SDOH Transportation: No   Social Connections: Low Risk     SDOH Social Isolation: 5 or more times a week   Intimate Partner Violence: Low Risk     SDOH Domestic Violence: No   Housing Stability: Low Risk     SDOH  Housing Situation: I have housing.    SDOH Housing Worry: No         ASSESSMENT    ICD-10-CM    1. Essential hypertension  I10       2. Vitamin B12 deficiency  E53.8       3. GAD (generalized anxiety disorder)  F41.1       4. Obesity (BMI 30.0-34.9)  E66.9           PLANS  Vss    Patient's blood pressure is now doing well.  Keep medications the same.      Increase Prozac to mg once a day.  PHQ-9 GAD-7 noted.  Added hydroxyzine p.r.n..  Discussed counseling with the patient wanted him to think on this.  He says he would.    Labs reviewed his work.  Labs look good.  Medication Orders   Medications    FLUoxetine (PROZAC) 40 mg Oral Capsule     Sig: Take 1 Capsule (40 mg total) by mouth Once a day     Dispense:  30 Capsule     Refill:  0    hydrOXYzine HCL (ATARAX) 25 mg Oral Tablet     Sig: Take 1 Tablet (25 mg total) by mouth Every 8 hours as needed for Anxiety     Dispense:  30 Tablet     Refill:  0           Records reviewed today:  Pt was educated/ counseled on the decisions made today with their involvement in these plans.  Active listening with pertinent questions asked today.   All questions answered and pt/family voice good understanding.     Immunizations:  (see nursing documentation)   Preventive:    Eat a well balanced, healthy diet with routine exercise.         FOLLOW UP:  Return in about 4 weeks (around 08/16/2022) for f/u anxiety. .    Patient can return sooner if needed.      Total time spent with patient visit:  20 minutes    The patient/care give was given ample opportunity to ask questions and those questions were answered to his/her satisfaction. A good faith effort was made to reconcile the patient's medications. The patient/caregiver was counseled on any appropriate vaccinations by the provider and questions were answered. The patient/care giver was told to contact me with any additional questions or concerns, or go to the ED in an emergency.   This note may have been partially generated using  M-Modal Fluency Direct system, and there may be some incorrect words, spellings, and punctuation that were not noted in checking the note before saving.  Annamarie Dawley, PA-C. 07/19/22 17:13  [facility]  [facility]  [address]  [place], [place] [address]  Phone [phone]

## 2022-07-19 NOTE — Nursing Note (Signed)
Body mass index is 31.22 kg/m.    Fall Risk Assessment  Do you feel unsteady when standing or walking?: Yes  Do you worry about falling?: No  Have you fallen in the past year?: No  Timed up and go test (in seconds): 11      PHQ Questionnaire  Little interest or pleasure in doing things.: Several Days  Feeling down, depressed, or hopeless: Several Days  PHQ 2 Total: 2  Trouble falling or staying asleep, or sleeping too much.: Several Days  Feeling tired or having little energy: More than half the days  Poor appetite or overeating: Not at all  Feeling bad about yourself/ that you are a failure in the past 2 weeks?: Several Days  Trouble concentrating on things in the past 2 weeks?: Several Days  Moving/Speaking slowly or being fidgety or restless  in the past 2 weeks?: Not at all  Thoughts that you would be better off DEAD, or of hurting yourself in some way.: Not at all  If you checked off any problems, how difficult have these problems made it for you to do your work, take care of things at home, or get along with other people?: Not difficult at all  PHQ 9 Total: 7  Interpretation of Total Score: 5-9 Mild depression              05/25/2022     3:58 PM 06/20/2022     4:01 PM 07/19/2022     3:53 PM   GAD-7 Questionnaire   Feeling nervous,anxious,on edge 2 2 2    Not being able to stop or control worrying 1 2 2    Worrying too much about different things 1 2 2    Trouble relaxing 1 2 2    Being so restless that it is hard to sit still 0 2 1   Becoming easily annoyed or irritable 2 2 2    Feeling afraid as if something awful might happen 1 1 2    How difficult have these problems made it for you to work, take care of things at home, or get along with other people? Not difficult at all Not difficult at all Not difficult at all   Gad-7 Score Total 8 13 13    Interpretation 5-9, mild anxiety 10-14, moderate anxiety 10-14, moderate anxiety         Functional Health Screening:           Travel Screening       Question Response     Have you been in contact with someone who was sick? No / Unsure    Do you have any of the following new or worsening symptoms? None of these    Have you traveled internationally or domestically in the last month? 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

## 2022-08-22 ENCOUNTER — Encounter (INDEPENDENT_AMBULATORY_CARE_PROVIDER_SITE_OTHER): Payer: Self-pay | Admitting: Physician Assistant

## 2022-08-22 ENCOUNTER — Ambulatory Visit: Payer: BC Managed Care – PPO | Attending: Physician Assistant | Admitting: Physician Assistant

## 2022-08-22 ENCOUNTER — Other Ambulatory Visit: Payer: Self-pay

## 2022-08-22 VITALS — BP 109/77 | HR 77 | Temp 97.6°F | Resp 17 | Ht 73.0 in | Wt 234.0 lb

## 2022-08-22 DIAGNOSIS — F321 Major depressive disorder, single episode, moderate: Secondary | ICD-10-CM | POA: Insufficient documentation

## 2022-08-22 DIAGNOSIS — Z6830 Body mass index (BMI) 30.0-30.9, adult: Secondary | ICD-10-CM | POA: Insufficient documentation

## 2022-08-22 DIAGNOSIS — Z79899 Other long term (current) drug therapy: Secondary | ICD-10-CM | POA: Insufficient documentation

## 2022-08-22 DIAGNOSIS — E669 Obesity, unspecified: Secondary | ICD-10-CM | POA: Insufficient documentation

## 2022-08-22 DIAGNOSIS — F411 Generalized anxiety disorder: Secondary | ICD-10-CM | POA: Insufficient documentation

## 2022-08-22 DIAGNOSIS — I10 Essential (primary) hypertension: Secondary | ICD-10-CM | POA: Insufficient documentation

## 2022-08-22 MED ORDER — ESCITALOPRAM 10 MG TABLET
10.0000 mg | ORAL_TABLET | Freq: Every day | ORAL | 0 refills | Status: DC
Start: 2022-08-22 — End: 2022-09-19

## 2022-08-22 MED ORDER — FLUOXETINE 20 MG CAPSULE
20.0000 mg | ORAL_CAPSULE | Freq: Every day | ORAL | 0 refills | Status: DC
Start: 2022-08-22 — End: 2022-09-19

## 2022-08-22 NOTE — Progress Notes (Signed)
[facility], [facility]  [address]  [place] [id number]           Name: Terrence GongDeron L Mejia MRN:  [id number]   Date: 08/22/2022 Age: 56 y.o.        SUBJECTIVE    Patient is 56 y.o. he  presents today for   Chief Complaint   Patient presents with    Anxiety     Patient presents today for follow up anxiety. Pt Prozac increased last visit. Started on hydroxyzine PRN. Patient states he still doesn't feel any different. Patient states he has taken the hydroxyzine and felt "no different". Patient states he even took 2 pills with no help with anxiety.          LOV:  07/19/2022 pt seen for HTN. Anxiety.  Patient's blood pressure is now doing well.  Keep medications the same.       Increase Prozac to mg once a day.  PHQ-9 GAD-7 noted.  Added hydroxyzine p.r.n..  Discussed counseling with the patient wanted him to think on this.  He says he would.     Labs reviewed his work.  Labs look good.    As above.   Today patient states that he is doing ok.    He notes that hydroxyzine doesn't help.  Nervousness is still present.    He is unsure if Prozac has helped.     No reported cp, sob, lightheadedness, swelling.     Patient appears to be tolerating meds fine.     Other chronic conditions remain stable.      Last labs: in the past couple mo.     ROS:  Systems otherwise negative than what has been noted above in the HPI.   PMH, surgical, FH, Social Hx, allergies, meds, all reviewed and up dated today.     Nursing Notes:   Bertis RuddyChristian, Tara, LPN  47/82/9509/19/23 [id number]  Signed  Body mass index is 30.87 kg/m.    Fall Risk Assessment  Do you feel unsteady when standing or walking?: Yes  Do you worry about falling?: No  Have you fallen in the past year?: No  Timed up and go test (in seconds): 11      PHQ Questionnaire  Little interest or pleasure in doing things.: Several Days  Feeling down, depressed, or hopeless: Several Days  PHQ 2 Total: 2  Trouble falling or staying asleep, or  sleeping too much.: More than half the days  Feeling tired or having little energy: More than half the days  Poor appetite or overeating: Several Days  Feeling bad about yourself/ that you are a failure in the past 2 weeks?: Several Days  Trouble concentrating on things in the past 2 weeks?: More than half the days  Moving/Speaking slowly or being fidgety or restless  in the past 2 weeks?: Several Days  Thoughts that you would be better off DEAD, or of hurting yourself in some way.: Not at all  If you checked off any problems, how difficult have these problems made it for you to do your work, take care of things at home, or get along with other people?: Somewhat difficult  PHQ 9 Total: 11  Interpretation of Total Score: 10-14 Moderate depression              05/25/2022     3:58 PM 06/20/2022     4:01 PM 07/19/2022     3:53 PM 08/22/2022  3:58 PM   GAD-7 Questionnaire   Feeling nervous,anxious,on edge 2 2 2 3    Not being able to stop or control worrying 1 2 2 2    Worrying too much about different things 1 2 2 2    Trouble relaxing 1 2 2 2    Being so restless that it is hard to sit still 0 2 1 1    Becoming easily annoyed or irritable 2 2 2 2    Feeling afraid as if something awful might happen 1 1 2 1    How difficult have these problems made it for you to work, take care of things at home, or get along with other people? Not difficult at all Not difficult at all Not difficult at all Somewhat difficult   Gad-7 Score Total 8 13 13 13    Interpretation 5-9, mild anxiety 10-14, moderate anxiety 10-14, moderate anxiety 10-14, moderate anxiety         Functional Health Screening:           Travel Screening       Question Response    Have you been in contact with someone who was sick? --    Do you have any of the following new or worsening symptoms? None of these    Have you traveled internationally in the last month? No          Travel History   Travel since 07/22/22    No documented travel since 07/22/22         Ulice Bold, LPN  03/12/1447, 18:56      OBJECTIVE    BP 109/77   Pulse 77   Temp 36.4 C (97.6 F)   Resp 17   Ht 1.854 m (6\' 1" )   Wt 106 kg (234 lb)   SpO2 96%   BMI 30.87 kg/m         General: Pleasant, WNWD, NAD, AAO  Head: Normocephalic, AT,no lesions.   Eyes:  no scleral icterus or injection.  Ears: external ear lobes without d/c, swelling, lesions.    Neck: No obvious JVD.   Chest: Lungs clear, no rales, no rhonchi, no wheezes.   Heart: RRR, no murmurs, no rubs, no gallops.  No clicks..    Back: No pain reported on ROM.    Extremities: Normal gait, no deformities, no edema.    Neuro: Physiological, no localizing findings.   Skin:  No rashes, no lesions noted.    Psych: Mood and Affect normal      PAST MEDICAL HISTORY    Current Outpatient Medications   Medication Sig    cloNIDine HCL (CATAPRES) 0.1 mg Oral Tablet 1 Tablet (0.1 mg total)    cyanocobalamin (VITAMIN B 12) 1,000 mcg Oral Tablet Take 1 Tablet (1,000 mcg total) by mouth Once a day    escitalopram oxalate (LEXAPRO) 10 mg Oral Tablet Take 1 Tablet (10 mg total) by mouth Once a day    FLUoxetine (PROZAC) 20 mg Oral Capsule Take 1 Capsule (20 mg total) by mouth Once a day    Lisinopril (PRINIVIL) 30 mg Oral Tablet Take 1 Tablet (30 mg total) by mouth Once a day     No Known Allergies  Past Medical History:   Diagnosis Date    Asthma     Hypertension     Migraine          Past Surgical History:   Procedure Laterality Date    REPLACEMENT TOTAL KNEE BILATERAL Bilateral 2007  Family Medical History:    None         Social History     Socioeconomic History    Marital status: Married   Tobacco Use    Smoking status: Never    Smokeless tobacco: Never   Vaping Use    Vaping Use: Never used   Substance and Sexual Activity    Alcohol use: Never    Drug use: Never     Social Determinants of Psychologist, prison and probation services Strain: Low Risk     SDOH Financial: No   Transportation Needs: Low Risk     SDOH Transportation: No   Social Connections: Low Risk      SDOH Social Isolation: 5 or more times a week   Intimate Partner Violence: Low Risk     SDOH Domestic Violence: No   Housing Stability: Low Risk     SDOH Housing Situation: I have housing.    SDOH Housing Worry: No         ASSESSMENT    ICD-10-CM    1. Essential hypertension  I10       2. GAD (generalized anxiety disorder)  F41.1       3. 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  That really seem to be helping the patient much.  Will go ahead and bring on Lexapro 10 mg once a day.  He is let me know if he has any trouble with these medications.  Our goal is to wean off the Prozac and see how he does on the Lexapro.      Blood pressure is doing great.  Continue same medications unchanged.      Patient can stop hydroxyzine altogether given the lack of improvement.  It appears that he had BuSpar in the past with lack of help but patient thinks he was on a low dose.  He is going to find out the exact name and dosing for me and let me know.  Medication Orders   Medications    FLUoxetine (PROZAC) 20 mg Oral Capsule     Sig: Take 1 Capsule (20 mg total) by mouth Once a day     Dispense:  30 Capsule     Refill:  0    escitalopram oxalate (LEXAPRO) 10 mg Oral Tablet     Sig: Take 1 Tablet (10 mg total) by mouth Once a day     Dispense:  30 Tablet     Refill:  0           Records reviewed today:  Pt was educated/ counseled on the decisions made today with their involvement in these plans.  Active listening with pertinent questions asked today.   All questions answered and pt/family voice good understanding.     Immunizations:  (see nursing documentation)   Preventive:    Eat a well balanced, healthy diet with routine exercise.         FOLLOW UP:  Return in about 4 weeks (around 09/19/2022) for f/u anxiety. .    Patient can return sooner if needed.      Total  time spent with patient visit:  15 minutes    The patient/care  give was given ample opportunity to ask questions and those questions were answered to his/her satisfaction. A good faith effort was made to reconcile the patient's medications. The patient/caregiver was counseled on any appropriate vaccinations by the provider and questions were answered. The patient/care giver was told to contact me with any additional questions or concerns, or go to the ED in an emergency.   This note may have been partially generated using M-Modal Fluency Direct system, and there may be some incorrect words, spellings, and punctuation that were not noted in checking the note before saving.    Annamarie Dawley, PA-C. 08/22/22 17:20  [facility]  [facility]  [address]  [place], [place] [address]  Phone [phone]

## 2022-08-22 NOTE — Nursing Note (Signed)
Body mass index is 30.87 kg/m.    Fall Risk Assessment  Do you feel unsteady when standing or walking?: Yes  Do you worry about falling?: No  Have you fallen in the past year?: No  Timed up and go test (in seconds): 11      PHQ Questionnaire  Little interest or pleasure in doing things.: Several Days  Feeling down, depressed, or hopeless: Several Days  PHQ 2 Total: 2  Trouble falling or staying asleep, or sleeping too much.: More than half the days  Feeling tired or having little energy: More than half the days  Poor appetite or overeating: Several Days  Feeling bad about yourself/ that you are a failure in the past 2 weeks?: Several Days  Trouble concentrating on things in the past 2 weeks?: More than half the days  Moving/Speaking slowly or being fidgety or restless  in the past 2 weeks?: Several Days  Thoughts that you would be better off DEAD, or of hurting yourself in some way.: Not at all  If you checked off any problems, how difficult have these problems made it for you to do your work, take care of things at home, or get along with other people?: Somewhat difficult  PHQ 9 Total: 11  Interpretation of Total Score: 10-14 Moderate depression              05/25/2022     3:58 PM 06/20/2022     4:01 PM 07/19/2022     3:53 PM 08/22/2022     3:58 PM   GAD-7 Questionnaire   Feeling nervous,anxious,on edge 2 2 2 3    Not being able to stop or control worrying 1 2 2 2    Worrying too much about different things 1 2 2 2    Trouble relaxing 1 2 2 2    Being so restless that it is hard to sit still 0 2 1 1    Becoming easily annoyed or irritable 2 2 2 2    Feeling afraid as if something awful might happen 1 1 2 1    How difficult have these problems made it for you to work, take care of things at home, or get along with other people? Not difficult at all Not difficult at all Not difficult at all Somewhat difficult   Gad-7 Score Total 8 13 13 13    Interpretation 5-9, mild anxiety 10-14, moderate anxiety 10-14, moderate anxiety  10-14, moderate anxiety         Functional Health Screening:           Travel Screening       Question Response    Have you been in contact with someone who was sick? --    Do you have any of the following new or worsening symptoms? None of these    Have you traveled internationally in the last month? No          Travel History   Travel since 07/22/22    No documented travel since 07/22/22         Ulice Bold, LPN  8/36/6294, 76:54

## 2022-09-19 ENCOUNTER — Encounter (INDEPENDENT_AMBULATORY_CARE_PROVIDER_SITE_OTHER): Payer: Self-pay | Admitting: Physician Assistant

## 2022-09-19 ENCOUNTER — Ambulatory Visit: Payer: BC Managed Care – PPO | Attending: Physician Assistant | Admitting: Physician Assistant

## 2022-09-19 ENCOUNTER — Other Ambulatory Visit: Payer: Self-pay

## 2022-09-19 VITALS — BP 123/86 | HR 77 | Temp 97.6°F | Resp 17 | Ht 73.0 in | Wt 238.0 lb

## 2022-09-19 DIAGNOSIS — F321 Major depressive disorder, single episode, moderate: Secondary | ICD-10-CM | POA: Insufficient documentation

## 2022-09-19 DIAGNOSIS — I10 Essential (primary) hypertension: Secondary | ICD-10-CM | POA: Insufficient documentation

## 2022-09-19 DIAGNOSIS — Z79899 Other long term (current) drug therapy: Secondary | ICD-10-CM | POA: Insufficient documentation

## 2022-09-19 DIAGNOSIS — Z6831 Body mass index (BMI) 31.0-31.9, adult: Secondary | ICD-10-CM | POA: Insufficient documentation

## 2022-09-19 DIAGNOSIS — J3089 Other allergic rhinitis: Secondary | ICD-10-CM | POA: Insufficient documentation

## 2022-09-19 DIAGNOSIS — F411 Generalized anxiety disorder: Secondary | ICD-10-CM | POA: Insufficient documentation

## 2022-09-19 DIAGNOSIS — H65192 Other acute nonsuppurative otitis media, left ear: Secondary | ICD-10-CM | POA: Insufficient documentation

## 2022-09-19 DIAGNOSIS — E669 Obesity, unspecified: Secondary | ICD-10-CM | POA: Insufficient documentation

## 2022-09-19 MED ORDER — BUSPIRONE 7.5 MG TABLET
7.5000 mg | ORAL_TABLET | Freq: Two times a day (BID) | ORAL | 0 refills | Status: DC | PRN
Start: 2022-09-19 — End: 2022-10-05

## 2022-09-19 MED ORDER — ESCITALOPRAM 20 MG TABLET
20.0000 mg | ORAL_TABLET | Freq: Every day | ORAL | 0 refills | Status: DC
Start: 2022-09-19 — End: 2022-11-21

## 2022-09-19 MED ORDER — LISINOPRIL 30 MG TABLET
30.0000 mg | ORAL_TABLET | Freq: Every day | ORAL | 4 refills | Status: DC
Start: 2022-09-19 — End: 2023-07-06

## 2022-09-28 ENCOUNTER — Other Ambulatory Visit: Payer: Self-pay

## 2022-10-05 ENCOUNTER — Other Ambulatory Visit (INDEPENDENT_AMBULATORY_CARE_PROVIDER_SITE_OTHER): Payer: Self-pay | Admitting: Physician Assistant

## 2022-10-05 MED ORDER — BUSPIRONE 10 MG TABLET
10.0000 mg | ORAL_TABLET | Freq: Two times a day (BID) | ORAL | 0 refills | Status: DC | PRN
Start: 2022-10-05 — End: 2022-11-07

## 2022-11-07 ENCOUNTER — Other Ambulatory Visit (INDEPENDENT_AMBULATORY_CARE_PROVIDER_SITE_OTHER): Payer: Self-pay | Admitting: Physician Assistant

## 2022-11-07 MED ORDER — BUSPIRONE 10 MG TABLET
10.0000 mg | ORAL_TABLET | Freq: Two times a day (BID) | ORAL | 0 refills | Status: DC | PRN
Start: 2022-11-07 — End: 2023-01-31

## 2022-11-21 ENCOUNTER — Ambulatory Visit: Payer: BC Managed Care – PPO | Attending: Physician Assistant | Admitting: Physician Assistant

## 2022-11-21 ENCOUNTER — Encounter (INDEPENDENT_AMBULATORY_CARE_PROVIDER_SITE_OTHER): Payer: Self-pay | Admitting: Physician Assistant

## 2022-11-21 ENCOUNTER — Telehealth (INDEPENDENT_AMBULATORY_CARE_PROVIDER_SITE_OTHER): Payer: Self-pay | Admitting: Physician Assistant

## 2022-11-21 ENCOUNTER — Other Ambulatory Visit: Payer: Self-pay

## 2022-11-21 VITALS — Ht 73.0 in | Wt 238.0 lb

## 2022-11-21 DIAGNOSIS — Z6831 Body mass index (BMI) 31.0-31.9, adult: Secondary | ICD-10-CM | POA: Insufficient documentation

## 2022-11-21 DIAGNOSIS — E785 Hyperlipidemia, unspecified: Secondary | ICD-10-CM | POA: Insufficient documentation

## 2022-11-21 DIAGNOSIS — E669 Obesity, unspecified: Secondary | ICD-10-CM | POA: Insufficient documentation

## 2022-11-21 DIAGNOSIS — F321 Major depressive disorder, single episode, moderate: Secondary | ICD-10-CM | POA: Insufficient documentation

## 2022-11-21 DIAGNOSIS — I10 Essential (primary) hypertension: Secondary | ICD-10-CM | POA: Insufficient documentation

## 2022-11-21 DIAGNOSIS — Z79899 Other long term (current) drug therapy: Secondary | ICD-10-CM | POA: Insufficient documentation

## 2022-11-21 DIAGNOSIS — F411 Generalized anxiety disorder: Secondary | ICD-10-CM | POA: Insufficient documentation

## 2022-11-21 MED ORDER — SERTRALINE 25 MG TABLET
25.0000 mg | ORAL_TABLET | Freq: Every day | ORAL | 0 refills | Status: DC
Start: 2022-11-21 — End: 2022-12-28

## 2022-11-21 NOTE — Progress Notes (Signed)
FAMILY MEDICINE, [facility]  [place] [address]      Operated by [facility]     Name: Terrence Mejia MRN:  [id number]   Date: 11/21/2022 Age: 56 y.o.      Virtual visit. Video call.  Pt has given verbal consent.    Patient location: [facility]  Patient understands where provider is located.   Exam performed by provider: Drexel Iha. Uvaldo Rising PA-C    SUBJECTIVE    Patient is 56 y.o. he  presents today for   Chief Complaint   Patient presents with    Hypertension     Patient presents today for follow up HTN . He states BP is running 128/75. Maintained on lisinopril 30 mg daily.     Anxiety     Patient currently using Buspar 10 mg. Patient states medication is not helping with mood.  Stopped Lexapro.         LOV:  09/19/2022.  Pt seen with HTN. Claritin daily.   We will see if this helps his effusion of the left ear.     Increase Lexapro to 20 mg daily for about a week or so.  He is to contact me let me know how this goes.  If this does not help then I want him to go ahead start taking half work back down over week or 2 to get back off this medication.  When he calls if he is not doing well with the medication then I will   Plan [place] ahead and bring on a new medication.      Start BuSpar little bit higher doses 7.5 mg twice a day as needed.  We will see if this provides any benefit workup from there.    As above.   Today patient states he is doing ok.   Says that he has weaned off the Lexapro.     No reported cp, sob, lightheadedness, swelling.     Patient appears to be tolerating meds fine.     Other chronic conditions remain stable.      Last labs: July 2023.     ROS:  Systems otherwise negative than what has been noted above in the HPI.   PMH, surgical, FH, Social Hx, allergies, meds, all reviewed and up dated today.     Nursing Notes:   Ulice Bold, LPN  [id number]  Signed  Body mass index is 31.4 kg/m.    Fall Risk Assessment          PHQ Questionnaire  Little interest or pleasure in doing things.: Several Days  Feeling down, depressed, or hopeless: Several Days  PHQ 2 Total: 2  Trouble falling or staying asleep, or sleeping too much.: Several Days  Feeling tired or having little energy: More than half the days  Poor appetite or overeating: Several Days  Feeling bad about yourself/ that you are a failure in the past 2 weeks?: Several Days  Trouble concentrating on things in the past 2 weeks?: Several Days  Moving/Speaking slowly or being fidgety or restless  in the past 2 weeks?: More than half the days  Thoughts that you would be better off DEAD, or of hurting yourself in some way.: Not at all  If you checked off any problems, how difficult have these problems made it for you to do your work, take care of things at home, or get along with other people?: Not difficult at  all  PHQ 9 Total: 10  Interpretation of Total Score: 10-14 Moderate depression              05/25/2022     3:58 PM 06/20/2022     4:01 PM 07/19/2022     3:53 PM 08/22/2022     3:58 PM 09/19/2022     3:57 PM 11/21/2022     4:37 PM   GAD-7 Questionnaire   Feeling nervous,anxious,on edge 2 2 2 3 3 2    Not being able to stop or control worrying 1 2 2 2 2 2    Worrying too much about different things 1 2 2 2 2 2    Trouble relaxing 1 2 2 2 2 2    Being so restless that it is hard to sit still 0 2 1 1 1 1    Becoming easily annoyed or irritable 2 2 2 2 2 2    Feeling afraid as if something awful might happen 1 1 2 1 1 1    How difficult have these problems made it for you to work, take care of things at home, or get along with other people? Not difficult at all Not difficult at all Not difficult at all Somewhat difficult Somewhat difficult Not difficult at all   Gad-7 Score Total 8 13 13 13 13 12    Interpretation 5-9, mild anxiety 10-14, moderate anxiety 10-14, moderate anxiety 10-14, moderate anxiety 10-14, moderate anxiety 10-14, moderate anxiety         Functional Health Screening:            Travel Screening       Question Response    Have you been in contact with someone who was sick? --    Do you have any of the following new or worsening symptoms? None of these    Have you traveled internationally in the last month? No          Travel History   Travel since 10/22/22    No documented travel since 10/22/22         Ulice Bold, LPN  [id number], [id number]      OBJECTIVE    Ht 1.854 m (6\' 1" )   Wt 108 kg (238 lb)   BMI 31.40 kg/m         General: Pleasant, WNWD, NAD, AAO  Head: Normocephalic, AT,no lesions.   Eyes: no lid lag, no d/c.  No proptosis noted.     Ears: external ear lobes without d/c, swelling, lesions.    Nose:  No gross septal deviation.  No rhinnorhea noted, no lesions  Neck: no obvious JVD.    Chest: unlabored.   Neuro: no facial asymmetry.    Skin:  No rashes, no lesions noted.    Psych: Mood and Affect normal      PAST MEDICAL HISTORY    Current Outpatient Medications   Medication Sig    busPIRone (BUSPAR) 10 mg Oral Tablet Take 1 Tablet (10 mg total) by mouth Twice per day as needed (anxiety)    cloNIDine HCL (CATAPRES) 0.1 mg Oral Tablet 1 Tablet (0.1 mg total)    cyanocobalamin (VITAMIN B 12) 1,000 mcg Oral Tablet Take 1 Tablet (1,000 mcg total) by mouth Once a day    Lisinopril (PRINIVIL) 30 mg Oral Tablet Take 1 Tablet (30 mg total) by mouth Once a day    sertraline (ZOLOFT) 25 mg Oral Tablet Take 1 Tablet (25 mg total) by mouth Once a day  No Known Allergies  Past Medical History:   Diagnosis Date    Asthma     Hypertension     Migraine          Past Surgical History:   Procedure Laterality Date    REPLACEMENT TOTAL KNEE BILATERAL Bilateral 2007         Family Medical History:    None         Social History     Socioeconomic History    Marital status: Married   Tobacco Use    Smoking status: Never    Smokeless tobacco: Never   Vaping Use    Vaping Use: Never used   Substance and Sexual Activity    Alcohol use: Never    Drug use: Never     Social Determinants of Health      Financial Resource Strain: Low Risk  (05/25/2022)    Financial Resource Strain     SDOH Financial: No   Transportation Needs: Low Risk  (05/25/2022)    Transportation Needs     SDOH Transportation: No   Social Connections: Low Risk  (05/25/2022)    Social Connections     SDOH Social Isolation: 5 or more times a week   Intimate Partner Violence: Low Risk  (05/25/2022)    Intimate Partner Violence     SDOH Domestic Violence: No   Housing Stability: Low Risk  (05/25/2022)    Housing Stability     SDOH Housing Situation: I have housing.     SDOH Housing Worry: No         ASSESSMENT    ICD-10-CM    1. Medication management  Z79.899       2. GAD (generalized anxiety disorder)  F41.1       3. 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 Essential hypertension  I10       5. Obesity (BMI 30.0-34.9)  E66.9       6. Hyperlipidemia, unspecified hyperlipidemia type  E78.5           PLANS    Pleased with home BP readings.  Cont same med unchanged.     Off Lexapro.   Stop Buspar.  Wasn't working.   Start Zoloft trial.   Has declined counseling.     New lipid panel in the near future.         Medication Orders   Medications    sertraline (ZOLOFT) 25 mg Oral Tablet     Sig: Take 1 Tablet (25 mg total) by mouth Once a day     Dispense:  30 Tablet     Refill:  0           Records reviewed today:  Pt was educated/ counseled on the decisions made today with their involvement in these plans.  Active listening with pertinent questions asked today.   All questions answered and pt/family voice good understanding.     Immunizations:  (see nursing chief complaint documentation)   Preventive:    Diet and exercise reviewed  See dentist and eye doctor with regular scheduled visits.      FOLLOW UP:  Return in about 4 weeks (around 12/19/2022) for f/u anxiety, agitation. .    Patient can return sooner if needed.      Total time spent with patient visit:  8 minutes.     The patient/care give was given ample opportunity to ask questions and  those questions  were answered to his/her satisfaction. A good faith effort was made to reconcile the patient's medications. The patient/caregiver was counseled on any appropriate vaccinations by the provider and questions were answered. The patient/care giver was told to contact me with any additional questions or concerns, or go to the ED in an emergency.   This note may have been partially generated using M-Modal Fluency Direct system, and there may be some incorrect words, spellings, and punctuation that were not noted in checking the note before saving.    Tenna Delaine, PA-C. 11/21/22 16:54  [facility]  [address]  [place], [place] [address]  Phone [phone]

## 2022-11-21 NOTE — Nursing Note (Signed)
Body mass index is 31.4 kg/m.    Fall Risk Assessment         PHQ Questionnaire  Little interest or pleasure in doing things.: Several Days  Feeling down, depressed, or hopeless: Several Days  PHQ 2 Total: 2  Trouble falling or staying asleep, or sleeping too much.: Several Days  Feeling tired or having little energy: More than half the days  Poor appetite or overeating: Several Days  Feeling bad about yourself/ that you are a failure in the past 2 weeks?: Several Days  Trouble concentrating on things in the past 2 weeks?: Several Days  Moving/Speaking slowly or being fidgety or restless  in the past 2 weeks?: More than half the days  Thoughts that you would be better off DEAD, or of hurting yourself in some way.: Not at all  If you checked off any problems, how difficult have these problems made it for you to do your work, take care of things at home, or get along with other people?: Not difficult at all  PHQ 9 Total: 10  Interpretation of Total Score: 10-14 Moderate depression              05/25/2022     3:58 PM 06/20/2022     4:01 PM 07/19/2022     3:53 PM 08/22/2022     3:58 PM 09/19/2022     3:57 PM 11/21/2022     4:37 PM   GAD-7 Questionnaire   Feeling nervous,anxious,on edge 2 2 2 3 3 2    Not being able to stop or control worrying 1 2 2 2 2 2    Worrying too much about different things 1 2 2 2 2 2    Trouble relaxing 1 2 2 2 2 2    Being so restless that it is hard to sit still 0 2 1 1 1 1    Becoming easily annoyed or irritable 2 2 2 2 2 2    Feeling afraid as if something awful might happen 1 1 2 1 1 1    How difficult have these problems made it for you to work, take care of things at home, or get along with other people? Not difficult at all Not difficult at all Not difficult at all Somewhat difficult Somewhat difficult Not difficult at all   Gad-7 Score Total 8 13 13 13 13 12    Interpretation 5-9, mild anxiety 10-14, moderate anxiety 10-14, moderate anxiety 10-14, moderate anxiety 10-14, moderate anxiety 10-14,  moderate anxiety         Functional Health Screening:           Travel Screening       Question Response    Have you been in contact with someone who was sick? --    Do you have any of the following new or worsening symptoms? None of these    Have you traveled internationally in the last month? No          Travel History   Travel since 10/22/22    No documented travel since 10/22/22         , LPN  , 16:39

## 2022-11-21 NOTE — Telephone Encounter (Signed)
Tried reaching out to pt to get him checked in for a My Chart and there is no answer.  Tried 3x on both numbers.   JC 11/21/2022 4:37pm

## 2022-12-28 ENCOUNTER — Other Ambulatory Visit (INDEPENDENT_AMBULATORY_CARE_PROVIDER_SITE_OTHER): Payer: Self-pay | Admitting: Physician Assistant

## 2022-12-28 MED ORDER — SERTRALINE 25 MG TABLET
25.0000 mg | ORAL_TABLET | Freq: Every day | ORAL | 0 refills | Status: DC
Start: 2022-12-28 — End: 2023-01-31

## 2023-01-05 ENCOUNTER — Encounter (INDEPENDENT_AMBULATORY_CARE_PROVIDER_SITE_OTHER): Payer: Self-pay

## 2023-01-05 ENCOUNTER — Telehealth (INDEPENDENT_AMBULATORY_CARE_PROVIDER_SITE_OTHER): Payer: Self-pay | Admitting: Physician Assistant

## 2023-01-05 NOTE — Telephone Encounter (Signed)
Attempted to call pt, but no answer, lvm. Calling to advise pt of provider not being in office on 01/16/2023 and apt was rescheduled. Sent MyChart message and also sent a new apt letter in the mail today.    Comments added by Debarah Crape on 01/05/23 at 11:33.

## 2023-01-16 ENCOUNTER — Ambulatory Visit: Payer: No Typology Code available for payment source | Admitting: Physician Assistant

## 2023-01-31 ENCOUNTER — Ambulatory Visit: Payer: BC Managed Care – PPO | Attending: Physician Assistant | Admitting: Physician Assistant

## 2023-01-31 ENCOUNTER — Encounter (INDEPENDENT_AMBULATORY_CARE_PROVIDER_SITE_OTHER): Payer: Self-pay | Admitting: Physician Assistant

## 2023-01-31 VITALS — Ht 73.0 in | Wt 228.0 lb

## 2023-01-31 DIAGNOSIS — Z79899 Other long term (current) drug therapy: Secondary | ICD-10-CM | POA: Insufficient documentation

## 2023-01-31 DIAGNOSIS — E669 Obesity, unspecified: Secondary | ICD-10-CM | POA: Insufficient documentation

## 2023-01-31 DIAGNOSIS — Z96653 Presence of artificial knee joint, bilateral: Secondary | ICD-10-CM | POA: Insufficient documentation

## 2023-01-31 DIAGNOSIS — E785 Hyperlipidemia, unspecified: Secondary | ICD-10-CM | POA: Insufficient documentation

## 2023-01-31 DIAGNOSIS — J3089 Other allergic rhinitis: Secondary | ICD-10-CM | POA: Insufficient documentation

## 2023-01-31 DIAGNOSIS — F321 Major depressive disorder, single episode, moderate: Secondary | ICD-10-CM | POA: Insufficient documentation

## 2023-01-31 DIAGNOSIS — Z6830 Body mass index (BMI) 30.0-30.9, adult: Secondary | ICD-10-CM | POA: Insufficient documentation

## 2023-01-31 DIAGNOSIS — F411 Generalized anxiety disorder: Secondary | ICD-10-CM | POA: Insufficient documentation

## 2023-01-31 DIAGNOSIS — I10 Essential (primary) hypertension: Secondary | ICD-10-CM | POA: Insufficient documentation

## 2023-01-31 MED ORDER — SERTRALINE 50 MG TABLET
50.0000 mg | ORAL_TABLET | Freq: Every day | ORAL | 1 refills | Status: DC
Start: 2023-01-31 — End: 2023-04-03

## 2023-01-31 MED ORDER — HYDROXYZINE HCL 50 MG TABLET
50.0000 mg | ORAL_TABLET | Freq: Three times a day (TID) | ORAL | 1 refills | Status: DC | PRN
Start: 2023-01-31 — End: 2023-04-03

## 2023-01-31 NOTE — Nursing Note (Signed)
Body mass index is 30.08 kg/m.    Fall Risk Assessment       PHQ Questionnaire            Travel Screening     No screening recorded since 01/30/23 0000       Travel History   Travel since 12/31/22    No documented travel since 12/31/22       Functional Health Screening:          Candis Shine, LPN  624THL, [id number]

## 2023-01-31 NOTE — Progress Notes (Signed)
FAMILY MEDICINE, [facility]  [place] [address]      Operated by [facility]     Name: Terrence Mejia MRN:  [id number]   Date: 01/31/2023 Age: 57 y.o.        SUBJECTIVE    Patient is 57 y.o. he  presents today for   Chief Complaint   Patient presents with    Medication Refill     Follow up        LOV:  11/21/2022 pt seen with med management. Pleased with home BP readings.  Cont same med unchanged.      Off Lexapro.   Stop Buspar.  Wasn't working.   Start Zoloft trial.   Has declined counseling.      New lipid panel in the near future.      As above.   Today patient states that the Zoloft seems to be helping some.  Think it has helped his moods.      BP doing well.  Running about 127/78.     No reported cp, sob, lightheadedness, swelling.     Patient appears to be tolerating meds fine.     Other chronic conditions remain stable.      Last labs: several mo ago at his job.     ROS:  Systems otherwise negative than what has been noted above in the HPI.   PMH, surgical, FH, Social Hx, allergies, meds, all reviewed and up dated today.     Nursing Notes:   Candis Shine, LPN  [id number]  Signed  Body mass index is 30.08 kg/m.    Fall Risk Assessment       PHQ Questionnaire            Travel Screening     No screening recorded since 01/30/23 0000       Travel History   Travel since 12/31/22    No documented travel since 12/31/22       Functional Health Screening:          Autumn Cain, LPN  624THL, [id number]    OBJECTIVE    Ht 1.854 m ('6\' 1"'$ )   Wt 103 kg (228 lb)   BMI 30.08 kg/m         General: Pleasant, WNWD, NAD, AAO  Head: Normocephalic, AT,no lesions.   Eyes:  no scleral icterus or injection.  Ears: external ear lobes without d/c, swelling, lesions.    Neck: No obvious JVD.   Chest: Lungs clear, no rales, no rhonchi, no wheezes.   Heart: RRR, no murmurs, no rubs, no gallops.  No clicks..    Back: No pain reported on ROM.     Extremities: Normal gait, no deformities, no edema.    Neuro: Physiological, no localizing findings.   Skin:  No rashes, no lesions noted.    Psych: Mood and Affect normal      PAST MEDICAL HISTORY    Current Outpatient Medications   Medication Sig    cloNIDine HCL (CATAPRES) 0.1 mg Oral Tablet 1 Tablet (0.1 mg total)    cyanocobalamin (VITAMIN B 12) 1,000 mcg Oral Tablet Take 1 Tablet (1,000 mcg total) by mouth Once a day (Patient not taking: Reported on 01/31/2023)    hydrOXYzine HCL (ATARAX) 50 mg Oral Tablet Take 1 Tablet (50 mg total) by mouth Three times a day as needed for Anxiety    Lisinopril (PRINIVIL) 30 mg Oral Tablet Take 1  Tablet (30 mg total) by mouth Once a day    sertraline (ZOLOFT) 50 mg Oral Tablet Take 1 Tablet (50 mg total) by mouth Once a day     No Known Allergies  Past Medical History:   Diagnosis Date    Asthma     Hypertension     Migraine          Past Surgical History:   Procedure Laterality Date    REPLACEMENT TOTAL KNEE BILATERAL Bilateral 2007         Family Medical History:    None         Social History     Socioeconomic History    Marital status: Married   Tobacco Use    Smoking status: Never    Smokeless tobacco: Never   Vaping Use    Vaping status: Never Used   Substance and Sexual Activity    Alcohol use: Never    Drug use: Never     Social Determinants of Health     Financial Resource Strain: Low Risk  (05/25/2022)    Financial Resource Strain     SDOH Financial: No   Transportation Needs: Low Risk  (05/25/2022)    Transportation Needs     SDOH Transportation: No   Social Connections: Low Risk  (05/25/2022)    Social Connections     SDOH Social Isolation: 5 or more times a week   Intimate Partner Violence: Low Risk  (05/25/2022)    Intimate Partner Violence     SDOH Domestic Violence: No   Housing Stability: Low Risk  (05/25/2022)    Housing Stability     SDOH Housing Situation: I have housing.     SDOH Housing Worry: No         ASSESSMENT    ICD-10-CM    1. GAD (generalized anxiety  disorder)  F41.1       2. Essential hypertension  I10       3. 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 Obesity (BMI 30.0-34.9)  E66.9       5. Hyperlipidemia, unspecified hyperlipidemia type  E78.5       6. Environmental and seasonal allergies  J30.89           PLANS  Vss    Plan new full labs 05/2023 at his work.     BP doing well. Same tx.     Zoloft appears to be helping. Increase dose.   Try higher dose hydroxyzine. No sedation with '25mg'$  reported by pt.     Medication Orders   Medications    sertraline (ZOLOFT) 50 mg Oral Tablet     Sig: Take 1 Tablet (50 mg total) by mouth Once a day     Dispense:  30 Tablet     Refill:  1    hydrOXYzine HCL (ATARAX) 50 mg Oral Tablet     Sig: Take 1 Tablet (50 mg total) by mouth Three times a day as needed for Anxiety     Dispense:  90 Tablet     Refill:  1           Records reviewed today:  Pt was educated/ counseled on the decisions made today with their involvement in these plans.  Active listening with pertinent questions asked today.   All questions answered and pt/family voice good understanding.     Immunizations:  (see nursing documentation)   Preventive:    Eat  a well balanced, healthy diet with routine exercise.         FOLLOW UP:  Return in about 4 weeks (around 02/28/2023) for f/u anxiety, .    Patient can return sooner if needed.      Total time spent with patient visit:  8 minutes.     The patient/care give was given ample opportunity to ask questions and those questions were answered to his/her satisfaction. A good faith effort was made to reconcile the patient's medications. The patient/caregiver was counseled on any appropriate vaccinations by the provider and questions were answered. The patient/care giver was told to contact me with any additional questions or concerns, or go to the ED in an emergency.   This note may have been partially generated using M-Modal Fluency Direct system, and there may be some incorrect words, spellings, and punctuation  that were not noted in checking the note before saving.    Tenna Delaine, PA-C. 01/31/23 15:36  [facility]  [address]  [place], [place] [address]  Phone [phone]

## 2023-02-20 DIAGNOSIS — M545 Low back pain, unspecified: Secondary | ICD-10-CM | POA: Insufficient documentation

## 2023-03-06 ENCOUNTER — Ambulatory Visit (INDEPENDENT_AMBULATORY_CARE_PROVIDER_SITE_OTHER): Payer: Self-pay | Admitting: Physician Assistant

## 2023-03-19 DIAGNOSIS — Z7689 Persons encountering health services in other specified circumstances: Secondary | ICD-10-CM | POA: Insufficient documentation

## 2023-03-19 DIAGNOSIS — E669 Obesity, unspecified: Secondary | ICD-10-CM | POA: Insufficient documentation

## 2023-04-03 ENCOUNTER — Ambulatory Visit: Payer: BC Managed Care – PPO | Attending: Physician Assistant | Admitting: Physician Assistant

## 2023-04-03 ENCOUNTER — Other Ambulatory Visit: Payer: Self-pay

## 2023-04-03 ENCOUNTER — Encounter (INDEPENDENT_AMBULATORY_CARE_PROVIDER_SITE_OTHER): Payer: Self-pay | Admitting: Physician Assistant

## 2023-04-03 VITALS — BP 135/86 | HR 83 | Temp 97.8°F | Resp 17 | Ht 73.0 in | Wt 223.0 lb

## 2023-04-03 DIAGNOSIS — F320 Major depressive disorder, single episode, mild: Secondary | ICD-10-CM

## 2023-04-03 DIAGNOSIS — F411 Generalized anxiety disorder: Secondary | ICD-10-CM | POA: Insufficient documentation

## 2023-04-03 DIAGNOSIS — I10 Essential (primary) hypertension: Secondary | ICD-10-CM | POA: Insufficient documentation

## 2023-04-03 DIAGNOSIS — K029 Dental caries, unspecified: Secondary | ICD-10-CM | POA: Insufficient documentation

## 2023-04-03 DIAGNOSIS — Z760 Encounter for issue of repeat prescription: Secondary | ICD-10-CM | POA: Insufficient documentation

## 2023-04-03 DIAGNOSIS — K047 Periapical abscess without sinus: Secondary | ICD-10-CM | POA: Insufficient documentation

## 2023-04-03 DIAGNOSIS — Z604 Social exclusion and rejection: Secondary | ICD-10-CM | POA: Insufficient documentation

## 2023-04-03 DIAGNOSIS — J3089 Other allergic rhinitis: Secondary | ICD-10-CM

## 2023-04-03 MED ORDER — SERTRALINE 100 MG TABLET
100.0000 mg | ORAL_TABLET | Freq: Every day | ORAL | 0 refills | Status: DC
Start: 2023-04-03 — End: 2023-05-08

## 2023-04-03 MED ORDER — CLINDAMYCIN HCL 300 MG CAPSULE
300.0000 mg | ORAL_CAPSULE | Freq: Three times a day (TID) | ORAL | 0 refills | Status: AC
Start: 2023-04-03 — End: 2023-04-13

## 2023-04-03 MED ORDER — HYDROXYZINE HCL 50 MG TABLET
50.0000 mg | ORAL_TABLET | Freq: Three times a day (TID) | ORAL | 1 refills | Status: DC | PRN
Start: 2023-04-03 — End: 2023-07-06

## 2023-04-03 NOTE — Nursing Note (Signed)
Body mass index is 29.42 kg/m.    Fall Risk Assessment  Do you feel unsteady when standing or walking?: No  Do you worry about falling?: No  Have you fallen in the past year?: No      PHQ Questionnaire  Little interest or pleasure in doing things.: Several Days  Feeling down, depressed, or hopeless: Several Days  PHQ 2 Total: 2  Trouble falling or staying asleep, or sleeping too much.: More than half the days  Feeling tired or having little energy: More than half the days  Poor appetite or overeating: Not at all  Feeling bad about yourself/ that you are a failure in the past 2 weeks?: Several Days  Trouble concentrating on things in the past 2 weeks?: Several Days  Moving/Speaking slowly or being fidgety or restless  in the past 2 weeks?: Several Days  Thoughts that you would be better off DEAD, or of hurting yourself in some way.: Not at all  PHQ 9 Total: 9  Interpretation of Total Score: 5-9 Mild depression              05/25/2022     3:58 PM 06/20/2022     4:01 PM 07/19/2022     3:53 PM 08/22/2022     3:58 PM 09/19/2022     3:57 PM 11/21/2022     4:37 PM 04/03/2023     4:19 PM   GAD-7 Questionnaire   Feeling nervous,anxious,on edge 2 2 2 3 3 2 2    Not being able to stop or control worrying 1 2 2 2 2 2 1    Worrying too much about different things 1 2 2 2 2 2 1    Trouble relaxing 1 2 2 2 2 2 1    Being so restless that it is hard to sit still 0 2 1 1 1 1 2    Becoming easily annoyed or irritable 2 2 2 2 2 2 2    Feeling afraid as if something awful might happen 1 1 2 1 1 1 2    How difficult have these problems made it for you to work, take care of things at home, or get along with other people? Not difficult at all Not difficult at all Not difficult at all Somewhat difficult Somewhat difficult Not difficult at all    Gad-7 Score Total 8 13 13 13 13 12 11    Interpretation 5-9, mild anxiety 10-14, moderate anxiety 10-14, moderate anxiety 10-14, moderate anxiety 10-14, moderate anxiety 10-14, moderate anxiety 10-14, moderate  anxiety         Functional Health Screening:            Bertis Ruddy, LPN  1/61/0960, 16:22

## 2023-04-03 NOTE — Progress Notes (Signed)
FAMILY MEDICINE, [facility]  [address]  [place] [address]      Operated by [facility]     Name: Terrence Mejia MRN:  [id number]   Date: 04/03/2023 Age: 57 y.o.        SUBJECTIVE    Patient is 57 y.o. he  presents today for   Chief Complaint   Patient presents with    Anxiety     Patient presents today for follow up anxiety. Increased Zoloft and hydroxyzine last visit.  Patient states that anxiety has improved but still feels like medication could be increased.         LOV:  01/31/2023 pt seen with GAD.    Plan new full labs 05/2023 at his work.      BP doing well. Same tx.      Zoloft appears to be helping. Increase dose.   Try higher dose hydroxyzine. No sedation with 25mg  reported by pt.     As above.   Today patient states that he has pulled something in his back since his last visit.  This occurred at work and under [employer].  Back is better.     Thinks the Zoloft is helping him.  Life stress is down some.  Used hydroxyzine some.  Thinks it does help some.      Last week his tooth started hurting. Took amoxil and helped some.  Took 500mg  tid x 10 days.  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  Started swelling yesterday morning after stopping amoxil.     BP went up when tooth started hurting.    Left lower tooth had pain. Pain is better but swelling is back or worse.  Has h/o poor teeth. Hasn't seen a dentist and doesn't want to go.     No reported cp, sob, lightheadedness.     Patient appears to be tolerating meds fine.     Other chronic conditions remain stable.      Last labs: 02/2023.     ROS:  Systems otherwise negative than what has been noted above in the HPI.   PMH, surgical, FH, Social Hx, allergies, meds, all reviewed and up dated today.     Nursing Notes:   Bertis Ruddy, LPN  47/82/95 6213  Signed  Body mass index is 29.42 kg/m.    Fall Risk Assessment  Do you feel unsteady when standing or walking?: No  Do you worry about falling?:  No  Have you fallen in the past year?: No      PHQ Questionnaire  Little interest or pleasure in doing things.: Several Days  Feeling down, depressed, or hopeless: Several Days  PHQ 2 Total: 2  Trouble falling or staying asleep, or sleeping too much.: More than half the days  Feeling tired or having little energy: More than half the days  Poor appetite or overeating: Not at all  Feeling bad about yourself/ that you are a failure in the past 2 weeks?: Several Days  Trouble concentrating on things in the past 2 weeks?: Several Days  Moving/Speaking slowly or being fidgety or restless  in the past 2 weeks?: Several Days  Thoughts that you would be better off DEAD, or of hurting yourself in some way.: Not at all  PHQ 9 Total: 9  Interpretation of Total Score: 5-9 Mild depression              05/25/2022     3:58 PM  06/20/2022     4:01 PM 07/19/2022     3:53 PM 08/22/2022     3:58 PM 09/19/2022     3:57 PM 11/21/2022     4:37 PM 04/03/2023     4:19 PM   GAD-7 Questionnaire   Feeling nervous,anxious,on edge 2 2 2 3 3 2 2    Not being able to stop or control worrying 1 2 2 2 2 2 1    Worrying too much about different things 1 2 2 2 2 2 1    Trouble relaxing 1 2 2 2 2 2 1    Being so restless that it is hard to sit still 0 2 1 1 1 1 2    Becoming easily annoyed or irritable 2 2 2 2 2 2 2    Feeling afraid as if something awful might happen 1 1 2 1 1 1 2    How difficult have these problems made it for you to work, take care of things at home, or get along with other people? Not difficult at all Not difficult at all Not difficult at all Somewhat difficult Somewhat difficult Not difficult at all    Gad-7 Score Total 8 13 13 13 13 12 11    Interpretation 5-9, mild anxiety 10-14, moderate anxiety 10-14, moderate anxiety 10-14, moderate anxiety 10-14, moderate anxiety 10-14, moderate anxiety 10-14, moderate anxiety         Functional Health Screening:            Bertis Ruddy, LPN  1/61/0960, 16:22    OBJECTIVE    BP 135/86   Pulse 83    Temp 36.6 C (97.8 F)   Resp 17   Ht 1.854 m (6\' 1" )   Wt 101 kg (223 lb)   SpO2 96%   BMI 29.42 kg/m         General: Pleasant, WNWD, NAD, AAO  Head: Normocephalic, AT,no lesions.   Eyes:  no scleral icterus or injection.  Ears: external ear lobes without d/c, swelling, lesions.    Mouth:  Patient has significant dental decay throughout.  He has some gingival edema around the left 1st molar lower aspect.  Periorbital edema noted in the left lower aspect.  Neck: No obvious JVD.  No lymphadenopathy noted.  Chest: Lungs clear, no rales, no rhonchi, no wheezes.   Heart: RRR, no murmurs, no rubs, no gallops.  No clicks..    Back: No pain reported on ROM.    Extremities: Normal gait, no deformities, no edema.    Neuro: Physiological, no localizing findings.   Skin:  No rashes, no lesions noted.    Psych: Mood and Affect normal      PAST MEDICAL HISTORY    Current Outpatient Medications   Medication Sig    clindamycin (CLEOCIN) 300 mg Oral Capsule Take 1 Capsule (300 mg total) by mouth Three times a day for 10 days    cloNIDine HCL (CATAPRES) 0.1 mg Oral Tablet 1 Tablet (0.1 mg total)    cyanocobalamin (VITAMIN B 12) 1,000 mcg Oral Tablet Take 1 Tablet (1,000 mcg total) by mouth Once a day (Patient not taking: Reported on 01/31/2023)    hydrOXYzine HCL (ATARAX) 50 mg Oral Tablet Take 1 Tablet (50 mg total) by mouth Three times a day as needed for Anxiety    Lisinopril (PRINIVIL) 30 mg Oral Tablet Take 1 Tablet (30 mg total) by mouth Once a day    sertraline (ZOLOFT) 100 mg Oral Tablet Take 1 Tablet (100 mg total)  by mouth Once a day     No Known Allergies  Past Medical History:   Diagnosis Date    Asthma     Hypertension     Migraine          Past Surgical History:   Procedure Laterality Date    REPLACEMENT TOTAL KNEE BILATERAL Bilateral 2007         Family Medical History:    None         Social History     Socioeconomic History    Marital status: Married   Tobacco Use    Smoking status: Never    Smokeless tobacco:  Never   Vaping Use    Vaping status: Never Used   Substance and Sexual Activity    Alcohol use: Never    Drug use: Never     Social Determinants of Health     Financial Resource Strain: Low Risk  (05/25/2022)    Financial Resource Strain     SDOH Financial: No   Transportation Needs: Low Risk  (05/25/2022)    Transportation Needs     SDOH Transportation: No   Social Connections: Low Risk  (05/25/2022)    Social Connections     SDOH Social Isolation: 5 or more times a week   Intimate Partner Violence: Low Risk  (05/25/2022)    Intimate Partner Violence     SDOH Domestic Violence: No   Housing Stability: Low Risk  (05/25/2022)    Housing Stability     SDOH Housing Situation: I have housing.     SDOH Housing Worry: No         ASSESSMENT    ICD-10-CM    1. Medication refill  Z76.0       2. GAD (generalized anxiety disorder)  F41.1       3. 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 Essential hypertension  I10       5. Environmental and seasonal allergies  J30.89       6. Dental infection  K04.7       7. Dental decay  K02.9           PLANS  Vss.  Wt down 15lbs.      Cont with his work regarding MRI.      Cont BP med same.  Likely up from his tooth.     Start clindamycin for his tooth.  Want to get a little more aggressive as this has not gone away with just penicillin.  He needs to follow up with dentist but he is very reluctant.  I have offered pre visit Valium but he is still reluctant.  He understands the risk.    Increase Zoloft to 100 mg daily.  Patient's has shown some improvement.  Tolerating med some fine.  Refill hydroxyzine.      Recent labs reviewed with the patient from his work.  Overall look pretty good.    Medication Orders   Medications    sertraline (ZOLOFT) 100 mg Oral Tablet     Sig: Take 1 Tablet (100 mg total) by mouth Once a day     Dispense:  30 Tablet     Refill:  0    hydrOXYzine HCL (ATARAX) 50 mg Oral Tablet     Sig: Take 1 Tablet (50 mg total) by mouth Three times a day as needed for Anxiety      Dispense:  90 Tablet  Refill:  1    clindamycin (CLEOCIN) 300 mg Oral Capsule     Sig: Take 1 Capsule (300 mg total) by mouth Three times a day for 10 days     Dispense:  30 Capsule     Refill:  0           Records reviewed today:  Pt was educated/ counseled on the decisions made today with their involvement in these plans.  Active listening with pertinent questions asked today.   All questions answered and pt/family voice good understanding.     Immunizations:  (see nursing documentation)   Preventive:    Eat a well balanced, healthy diet with routine exercise.         FOLLOW UP:  Return in about 4 weeks (around 05/01/2023) for f/u anxiety. .    Patient can return sooner if needed.      Total time spent with patient visit:  20 minutes.     The patient/care give was given ample opportunity to ask questions and those questions were answered to his/her satisfaction. A good faith effort was made to reconcile the patient's medications. The patient/caregiver was counseled on any appropriate vaccinations by the provider and questions were answered. The patient/care giver was told to contact me with any additional questions or concerns, or go to the ED in an emergency.   This note may have been partially generated using M-Modal Fluency Direct system, and there may be some incorrect words, spellings, and punctuation that were not noted in checking the note before saving.    Annamarie Dawley, PA-C. 04/03/23 16:57  [facility]  [facility]  [address]  [place], [place] [address]  Phone [phone]

## 2023-05-02 ENCOUNTER — Ambulatory Visit (INDEPENDENT_AMBULATORY_CARE_PROVIDER_SITE_OTHER): Payer: Self-pay | Admitting: Physician Assistant

## 2023-05-08 ENCOUNTER — Other Ambulatory Visit (INDEPENDENT_AMBULATORY_CARE_PROVIDER_SITE_OTHER): Payer: Self-pay | Admitting: Physician Assistant

## 2023-05-09 MED ORDER — SERTRALINE 100 MG TABLET
100.0000 mg | ORAL_TABLET | Freq: Every day | ORAL | 1 refills | Status: DC
Start: 2023-05-09 — End: 2023-07-06

## 2023-05-16 ENCOUNTER — Ambulatory Visit (INDEPENDENT_AMBULATORY_CARE_PROVIDER_SITE_OTHER): Payer: Self-pay | Admitting: Physician Assistant

## 2023-07-06 ENCOUNTER — Encounter (INDEPENDENT_AMBULATORY_CARE_PROVIDER_SITE_OTHER): Payer: Self-pay | Admitting: Physician Assistant

## 2023-07-06 ENCOUNTER — Ambulatory Visit: Payer: BC Managed Care – PPO | Attending: Physician Assistant | Admitting: Physician Assistant

## 2023-07-06 ENCOUNTER — Other Ambulatory Visit: Payer: Self-pay

## 2023-07-06 VITALS — BP 109/70 | HR 72 | Temp 97.8°F | Resp 17 | Ht 73.0 in | Wt 194.0 lb

## 2023-07-06 DIAGNOSIS — Z760 Encounter for issue of repeat prescription: Secondary | ICD-10-CM | POA: Insufficient documentation

## 2023-07-06 DIAGNOSIS — F411 Generalized anxiety disorder: Secondary | ICD-10-CM | POA: Insufficient documentation

## 2023-07-06 DIAGNOSIS — E785 Hyperlipidemia, unspecified: Secondary | ICD-10-CM

## 2023-07-06 DIAGNOSIS — I10 Essential (primary) hypertension: Secondary | ICD-10-CM | POA: Insufficient documentation

## 2023-07-06 DIAGNOSIS — F419 Anxiety disorder, unspecified: Secondary | ICD-10-CM | POA: Insufficient documentation

## 2023-07-06 DIAGNOSIS — Z79899 Other long term (current) drug therapy: Secondary | ICD-10-CM | POA: Insufficient documentation

## 2023-07-06 MED ORDER — SERTRALINE 100 MG TABLET
100.0000 mg | ORAL_TABLET | Freq: Every day | ORAL | 3 refills | Status: DC
Start: 2023-07-06 — End: 2024-01-02

## 2023-07-06 MED ORDER — LISINOPRIL 10 MG TABLET
10.0000 mg | ORAL_TABLET | Freq: Every day | ORAL | 1 refills | Status: DC
Start: 2023-07-06 — End: 2024-01-02

## 2023-07-06 MED ORDER — HYDROXYZINE HCL 50 MG TABLET
50.0000 mg | ORAL_TABLET | Freq: Three times a day (TID) | ORAL | 3 refills | Status: DC | PRN
Start: 2023-07-06 — End: 2024-01-02

## 2023-07-06 NOTE — Nursing Note (Signed)
07/06/23 1613   Recent Weight Change   Have you had a recent unexplained weight loss or gain? N   Health Education and Literacy   How often do you have a problem understanding what is told to you about your medical condition?  Never   Domestic Violence   Because we are aware of abuse and domestic violence today, we ask all patients: Are you being hurt, hit, or frightened by anyone at your home or in your life?  N   Basic Needs   Do you have any basic needs within your home that are not being met? (such as Food, Shelter, Civil Service fast streamer, Tranportation, paying for bills and/or medications) N   Advanced Directives   Do you have any advanced directives? No Advance   Would you like an advanced directive packet? Accepted Packet

## 2023-07-06 NOTE — Nursing Note (Signed)
Body mass index is 25.6 kg/m.    Fall Risk Assessment         PHQ Questionnaire  Little interest or pleasure in doing things.: Not at all  Feeling down, depressed, or hopeless: Not at all  PHQ 2 Total: 0  Trouble falling or staying asleep, or sleeping too much.: Several Days  Feeling tired or having little energy: More than half the days  Poor appetite or overeating: Not at all  Feeling bad about yourself/ that you are a failure in the past 2 weeks?: Not at all  Trouble concentrating on things in the past 2 weeks?: More than half the days  Moving/Speaking slowly or being fidgety or restless  in the past 2 weeks?: More than half the days  Thoughts that you would be better off DEAD, or of hurting yourself in some way.: Not at all  If you checked off any problems, how difficult have these problems made it for you to do your work, take care of things at home, or get along with other people?: Not difficult at all  PHQ 9 Total: 7  Interpretation of Total Score: 5-9 Mild depression              06/20/2022     4:01 PM 07/19/2022     3:53 PM 08/22/2022     3:58 PM 09/19/2022     3:57 PM 11/21/2022     4:37 PM 04/03/2023     4:19 PM 07/06/2023     4:12 PM   GAD-7 Questionnaire   Feeling nervous,anxious,on edge 2 2 3 3 2 2 2    Not being able to stop or control worrying 2 2 2 2 2 1 2    Worrying too much about different things 2 2 2 2 2 1 2    Trouble relaxing 2 2 2 2 2 1 1    Being so restless that it is hard to sit still 2 1 1 1 1 2 2    Becoming easily annoyed or irritable 2 2 2 2 2 2 3    Feeling afraid as if something awful might happen 1 2 1 1 1 2 1    How difficult have these problems made it for you to work, take care of things at home, or get along with other people? Not difficult at all Not difficult at all Somewhat difficult Somewhat difficult Not difficult at all  Not difficult at all   Gad-7 Score Total 13 13 13 13 12 11 13    Interpretation 10-14, moderate anxiety 10-14, moderate anxiety 10-14, moderate anxiety 10-14,  moderate anxiety 10-14, moderate anxiety 10-14, moderate anxiety 10-14, moderate anxiety         Review Flowsheet  More data exists         07/06/2023   FUNCTIONAL HEALTH SCREENING   Have you had a recent unexplained weight loss or gain? N   How often do you have a problem understanding what is told to you about your medical condition?  Never   Because we are aware of abuse and domestic violence today, we ask all patients: Are you being hurt, hit, or frightened by anyone at your home or in your life?  N   Do you have any basic needs within your home that are not being met? (such as Food, Shelter, Civil Service fast streamer, Tranportation, paying for bills and/or medications) N   Do you have any advanced directives? No Advance   Would you like an advanced directive packet? Accepted Packet  Details                     Bertis Ruddy, LPN  03/04/9378, 02:40

## 2023-07-06 NOTE — Progress Notes (Signed)
FAMILY MEDICINE, [facility]  [address]  [place] [address]      Operated by [facility]     Name: Terrence Mejia MRN:  [id number]   Date: 07/06/2023 Age: 57 y.o.        SUBJECTIVE    Patient is 57 y.o. he  presents today for   Chief Complaint   Patient presents with    Medication Refill     Patient presents today for medication review.     Hypertension     Patient states he has been having some low BP. He states he has been diet and exercising, lost weight.         LOV:  04/03/2023 pt seen with med refills. Cont with his work regarding MRI.       Cont BP med same.  Likely up from his tooth.      Start clindamycin for his tooth.  Want to get a little more aggressive as this has not gone away with just penicillin.  He needs to follow up with dentist but he is very reluctant.  I have offered pre visit Valium but he is still reluctant.  He understands the risk.     Increase Zoloft to 100 mg daily.  Patient's has shown some improvement.  Tolerating med some fine.  Refill hydroxyzine.      As above.   Today patient states that he doing pretty good.     Zoloft working well.      BP at home was 103/72.     Losing wt on Zepbound.     No reported cp, sob, lightheadedness, swelling.     Patient appears to be tolerating meds fine.     Other chronic conditions remain stable.      Last labs: July 2024 at his work.     ROS:  Systems otherwise negative than what has been noted above in the HPI.   PMH, surgical, FH, Social Hx, allergies, meds, all reviewed and up dated today.     Nursing Notes:   Bertis Ruddy, LPN  47/82/95 6213  Signed  Body mass index is 25.6 kg/m.    Fall Risk Assessment         PHQ Questionnaire  Little interest or pleasure in doing things.: Not at all  Feeling down, depressed, or hopeless: Not at all  PHQ 2 Total: 0  Trouble falling or staying asleep, or sleeping too much.: Several Days  Feeling tired or having little energy: More  than half the days  Poor appetite or overeating: Not at all  Feeling bad about yourself/ that you are a failure in the past 2 weeks?: Not at all  Trouble concentrating on things in the past 2 weeks?: More than half the days  Moving/Speaking slowly or being fidgety or restless  in the past 2 weeks?: More than half the days  Thoughts that you would be better off DEAD, or of hurting yourself in some way.: Not at all  If you checked off any problems, how difficult have these problems made it for you to do your work, take care of things at home, or get along with other people?: Not difficult at all  PHQ 9 Total: 7  Interpretation of Total Score: 5-9 Mild depression              06/20/2022     4:01 PM 07/19/2022     3:53 PM 08/22/2022  3:58 PM 09/19/2022     3:57 PM 11/21/2022     4:37 PM 04/03/2023     4:19 PM 07/06/2023     4:12 PM   GAD-7 Questionnaire   Feeling nervous,anxious,on edge 2 2 3 3 2 2 2    Not being able to stop or control worrying 2 2 2 2 2 1 2    Worrying too much about different things 2 2 2 2 2 1 2    Trouble relaxing 2 2 2 2 2 1 1    Being so restless that it is hard to sit still 2 1 1 1 1 2 2    Becoming easily annoyed or irritable 2 2 2 2 2 2 3    Feeling afraid as if something awful might happen 1 2 1 1 1 2 1    How difficult have these problems made it for you to work, take care of things at home, or get along with other people? Not difficult at all Not difficult at all Somewhat difficult Somewhat difficult Not difficult at all  Not difficult at all   Gad-7 Score Total 13 13 13 13 12 11 13    Interpretation 10-14, moderate anxiety 10-14, moderate anxiety 10-14, moderate anxiety 10-14, moderate anxiety 10-14, moderate anxiety 10-14, moderate anxiety 10-14, moderate anxiety         Review Flowsheet  More data exists         07/06/2023   FUNCTIONAL HEALTH SCREENING   Have you had a recent unexplained weight loss or gain? N   How often do you have a problem understanding what is told to you about your medical  condition?  Never   Because we are aware of abuse and domestic violence today, we ask all patients: Are you being hurt, hit, or frightened by anyone at your home or in your life?  N   Do you have any basic needs within your home that are not being met? (such as Food, Shelter, Civil Service fast streamer, Tranportation, paying for bills and/or medications) N   Do you have any advanced directives? No Advance   Would you like an advanced directive packet? Accepted Packet      Details                     Bertis Ruddy, LPN  07/05/9561, 13:08    Bertis Ruddy, LPN  65/78/46 9629  Signed     07/06/23 1613   Recent Weight Change   Have you had a recent unexplained weight loss or gain? N   Health Education and Literacy   How often do you have a problem understanding what is told to you about your medical condition?  Never   Domestic Violence   Because we are aware of abuse and domestic violence today, we ask all patients: Are you being hurt, hit, or frightened by anyone at your home or in your life?  N   Basic Needs   Do you have any basic needs within your home that are not being met? (such as Food, Shelter, Civil Service fast streamer, Tranportation, paying for bills and/or medications) N   Advanced Directives   Do you have any advanced directives? No Advance   Would you like an advanced directive packet? Accepted Packet         OBJECTIVE    BP 109/70   Pulse 72   Temp 36.6 C (97.8 F)   Resp 17   Ht 1.854 m (6\' 1" )   Wt 88 kg (194 lb)  SpO2 96%   BMI 25.60 kg/m       General: Pleasant, WNWD, NAD, AAO  Head: Normocephalic, AT,no lesions.   Eyes:  no scleral icterus or injection.  Ears: external ear lobes without d/c, swelling, lesions.    Neck: No obvious JVD.   Chest: Lungs clear, no rales, no rhonchi, no wheezes.   Heart: RRR, no murmurs, no rubs, no gallops.  No clicks..    Back: No pain reported on ROM.    Extremities: Normal gait, no deformities, no edema.    Neuro: Physiological, no localizing findings.   Skin:  No rashes, no lesions noted.     Psych: Mood and Affect normal    No visits with results within 3 Month(s) from this visit.   Latest known visit with results is:   No results found for any previous visit.       PAST MEDICAL HISTORY    Current Outpatient Medications   Medication Sig    cloNIDine HCL (CATAPRES) 0.1 mg Oral Tablet 1 Tablet (0.1 mg total) (Patient not taking: Reported on 07/06/2023)    cyanocobalamin (VITAMIN B 12) 1,000 mcg Oral Tablet Take 1 Tablet (1,000 mcg total) by mouth Once a day (Patient not taking: Reported on 01/31/2023)    hydrOXYzine HCL (ATARAX) 50 mg Oral Tablet Take 1 Tablet (50 mg total) by mouth Three times a day as needed for Anxiety    lisinopriL (PRINIVIL) 10 mg Oral Tablet Take 1 Tablet (10 mg total) by mouth Once a day    sertraline (ZOLOFT) 100 mg Oral Tablet Take 1 Tablet (100 mg total) by mouth Once a day    ZEPBOUND 5 mg/0.5 mL Subcutaneous Pen Injector Inject 0.5 mL (5 mg total) under the skin     No Known Allergies  Past Medical History:   Diagnosis Date    Asthma     Hypertension     Migraine          Past Surgical History:   Procedure Laterality Date    REPLACEMENT TOTAL KNEE BILATERAL Bilateral 2007         Family Medical History:    None         Social History     Socioeconomic History    Marital status: Married   Tobacco Use    Smoking status: Never    Smokeless tobacco: Never   Vaping Use    Vaping status: Never Used   Substance and Sexual Activity    Alcohol use: Never    Drug use: Never     Social Determinants of Health     Financial Resource Strain: Low Risk  (05/25/2022)    Financial Resource Strain     SDOH Financial: No   Transportation Needs: Low Risk  (05/25/2022)    Transportation Needs     SDOH Transportation: No   Social Connections: Low Risk  (05/25/2022)    Social Connections     SDOH Social Isolation: 5 or more times a week   Intimate Partner Violence: Low Risk  (05/25/2022)    Intimate Partner Violence     SDOH Domestic Violence: No   Housing Stability: Low Risk  (05/25/2022)    Housing  Stability     SDOH Housing Situation: I have housing.     SDOH Housing Worry: No         ASSESSMENT    ICD-10-CM    1. Medication refill  Z76.0       2. GAD (generalized  anxiety disorder)  F41.1       3. Essential hypertension  I10       4. Hyperlipidemia, unspecified hyperlipidemia type  E78.5           PLANS  Vss    Cut back on BP med to lisinopril to 10 mg daily and see how he does over the next couple weeks.  If his blood pressure is still staying low he can cut it out altogether.  I would say this is likely related to his wt loss.     Applauded wt loss.   Cont lifestyle changes in Zepbound.     Continue Zoloft same dose.  Patient seems to be doing very well.  Current ACE or ARB:   Active ACE/ARB Meds   Medication Sig    lisinopriL (PRINIVIL) 10 mg Oral Tablet Take 1 Tablet (10 mg total) by mouth Once a day             Medication Orders   Medications    sertraline (ZOLOFT) 100 mg Oral Tablet     Sig: Take 1 Tablet (100 mg total) by mouth Once a day     Dispense:  90 Tablet     Refill:  3    hydrOXYzine HCL (ATARAX) 50 mg Oral Tablet     Sig: Take 1 Tablet (50 mg total) by mouth Three times a day as needed for Anxiety     Dispense:  90 Tablet     Refill:  3    lisinopriL (PRINIVIL) 10 mg Oral Tablet     Sig: Take 1 Tablet (10 mg total) by mouth Once a day     Dispense:  30 Tablet     Refill:  1     Please put on hold until pt calls and ask for these meds.           Records reviewed today:  Pt was educated/ counseled on the decisions made today with their involvement in these plans.  Active listening with pertinent questions asked today.   All questions answered and pt/family voice good understanding.     Immunizations:  (see nursing documentation)   Preventive:    Eat a well balanced, healthy diet with routine exercise.         FOLLOW UP:  Return in about 4 months (around 11/05/2023) for annual exam. .    Patient can return sooner if needed.      Total time spent with patient visit:  20 minutes.     The  patient/care give was given ample opportunity to ask questions and those questions were answered to his/her satisfaction. A good faith effort was made to reconcile the patient's medications. The patient/caregiver was counseled on any appropriate vaccinations by the provider and questions were answered. The patient/care giver was told to contact me with any additional questions or concerns, or go to the ED in an emergency.   This note may have been partially generated using M-Modal Fluency Direct system, and there may be some incorrect words, spellings, and punctuation that were not noted in checking the note before saving.    Annamarie Dawley, PA-C. 07/06/23 17:21  [facility]  [facility]  [address]  [place], [place] [address]  Phone [phone]

## 2023-07-11 ENCOUNTER — Telehealth (INDEPENDENT_AMBULATORY_CARE_PROVIDER_SITE_OTHER): Payer: Self-pay | Admitting: Physician Assistant

## 2023-08-28 ENCOUNTER — Encounter (INDEPENDENT_AMBULATORY_CARE_PROVIDER_SITE_OTHER): Payer: Self-pay | Admitting: Physician Assistant

## 2023-11-07 ENCOUNTER — Ambulatory Visit (INDEPENDENT_AMBULATORY_CARE_PROVIDER_SITE_OTHER): Payer: Self-pay | Admitting: Physician Assistant

## 2023-11-30 ENCOUNTER — Other Ambulatory Visit: Payer: Self-pay

## 2024-01-02 ENCOUNTER — Encounter (INDEPENDENT_AMBULATORY_CARE_PROVIDER_SITE_OTHER): Payer: Self-pay | Admitting: Physician Assistant

## 2024-01-02 ENCOUNTER — Other Ambulatory Visit: Payer: Self-pay

## 2024-01-02 ENCOUNTER — Ambulatory Visit: Payer: BC Managed Care – PPO | Attending: Physician Assistant | Admitting: Physician Assistant

## 2024-01-02 VITALS — BP 108/78 | HR 53 | Temp 97.6°F | Resp 17 | Ht 73.0 in | Wt 195.0 lb

## 2024-01-02 DIAGNOSIS — E538 Deficiency of other specified B group vitamins: Secondary | ICD-10-CM | POA: Insufficient documentation

## 2024-01-02 DIAGNOSIS — R451 Restlessness and agitation: Secondary | ICD-10-CM | POA: Insufficient documentation

## 2024-01-02 DIAGNOSIS — R7989 Other specified abnormal findings of blood chemistry: Secondary | ICD-10-CM | POA: Insufficient documentation

## 2024-01-02 DIAGNOSIS — I10 Essential (primary) hypertension: Secondary | ICD-10-CM | POA: Insufficient documentation

## 2024-01-02 DIAGNOSIS — F411 Generalized anxiety disorder: Secondary | ICD-10-CM | POA: Insufficient documentation

## 2024-01-02 DIAGNOSIS — F321 Major depressive disorder, single episode, moderate: Secondary | ICD-10-CM | POA: Insufficient documentation

## 2024-01-02 DIAGNOSIS — Z712 Person consulting for explanation of examination or test findings: Secondary | ICD-10-CM | POA: Insufficient documentation

## 2024-01-02 MED ORDER — SERTRALINE 100 MG TABLET
100.0000 mg | ORAL_TABLET | Freq: Two times a day (BID) | ORAL | 0 refills | Status: AC
Start: 2024-01-02 — End: ?

## 2024-01-02 MED ORDER — HYDROXYZINE HCL 50 MG TABLET
50.0000 mg | ORAL_TABLET | Freq: Three times a day (TID) | ORAL | 3 refills | Status: AC | PRN
Start: 2024-01-02 — End: ?

## 2024-01-02 NOTE — Nursing Note (Signed)
Body mass index is 25.73 kg/m.    Fall Risk Assessment  Do you feel unsteady when standing or walking?: Yes  Do you worry about falling?: No  Have you fallen in the past year?: No  Timed up and go test (in seconds): 13      PHQ Questionnaire  Little interest or pleasure in doing things.: More than half the days  Feeling down, depressed, or hopeless: More than half the days  PHQ 2 Total: 4  Trouble falling or staying asleep, or sleeping too much.: Several Days  Feeling tired or having little energy: More than half the days  Poor appetite or overeating: Not at all  Feeling bad about yourself/ that you are a failure in the past 2 weeks?: Several Days  Trouble concentrating on things in the past 2 weeks?: More than half the days  Moving/Speaking slowly or being fidgety or restless  in the past 2 weeks?: Several Days  Thoughts that you would be better off DEAD, or of hurting yourself in some way.: Not at all  If you checked off any problems, how difficult have these problems made it for you to do your work, take care of things at home, or get along with other people?: Somewhat difficult  PHQ 9 Total: 11  Interpretation of Total Score: 10-14 Moderate depression              07/19/2022     3:53 PM 08/22/2022     3:58 PM 09/19/2022     3:57 PM 11/21/2022     4:37 PM 04/03/2023     4:19 PM 07/06/2023     4:12 PM 01/02/2024     4:30 PM   GAD-7 Questionnaire   Feeling nervous,anxious,on edge 2 3 3 2 2 2 2    Not being able to stop or control worrying 2 2 2 2 1 2 2    Worrying too much about different things 2 2 2 2 1 2 2    Trouble relaxing 2 2 2 2 1 1 2    Being so restless that it is hard to sit still 1 1 1 1 2 2 1    Becoming easily annoyed or irritable 2 2 2 2 2 3 2    Feeling afraid as if something awful might happen 2 1 1 1 2 1 1    How difficult have these problems made it for you to work, take care of things at home, or get along with other people? Not difficult at all Somewhat difficult Somewhat difficult Not difficult at all   Not difficult at all Somewhat difficult   Gad-7 Score Total 13 13 13 12 11 13 12    Interpretation 10-14, moderate anxiety 10-14, moderate anxiety 10-14, moderate anxiety 10-14, moderate anxiety 10-14, moderate anxiety 10-14, moderate anxiety 10-14, moderate anxiety         Review Flowsheet  More data exists         01/02/2024   FUNCTIONAL HEALTH SCREENING   Because we are aware of abuse and domestic violence today, we ask all patients: Are you being hurt, hit, or frightened by anyone at your home or in your life?  N   Do you have any basic needs within your home that are not being met? (such as Food, Shelter, [employer] fast streamer, Tranportation, paying for bills and/or medications) N      Details                     Bertis Ruddy, LPN  01/02/2024, 16:30

## 2024-01-02 NOTE — Progress Notes (Signed)
FAMILY MEDICINE, [facility]  [address]  [place] [address]      Operated by [facility]     Name: Terrence Mejia MRN:  [id number]   Date: 01/02/2024 Age: 58 y.o.        SUBJECTIVE    Patient is 58 y.o. he  presents today for   Chief Complaint   Patient presents with    Medication Check     Patient presents today for medication check. Patient would like Zoloft increase. He states his irritability has worsen.         LOV:  07/06/2023 patient was seen medication refills.Cut back on BP med to lisinopril to 10 mg daily and see how he does over the next couple weeks.  If his blood pressure is still staying low he can cut it out altogether.  I would say this is likely related to his wt loss.      Applauded wt loss.   Cont lifestyle changes in Zepbound.      Continue Zoloft same dose.  Patient seems to be doing very well.    As above.   Today patient states that he is doing well.    Said he has had little bit increased agitation and anxiety.  Zoloft was working very well but lately seems to not be keeping these under control.  Inquires about increasing the dose.  Negative side effects.    He has been off Zepbound in the past 6 weeks.  Has like he is doing well.  Staying active and eating well.      Blood pressure has been well with no need for medication    No reported cp, sob, lightheadedness, swelling.     Patient appears to be tolerating meds fine.     Other chronic conditions remain stable.      Last labs:  July 2023.    ROS:  Systems otherwise negative than what has been noted above in the HPI.   PMH, surgical, FH, Social Hx, allergies, meds, all reviewed and up dated today.     Nursing Notes:   Bertis Ruddy, LPN  47/82/95 6213  Signed  Body mass index is 25.73 kg/m.    Fall Risk Assessment  Do you feel unsteady when standing or walking?: Yes  Do you worry about falling?: No  Have you fallen in the past year?: No  Timed up and go test (in  seconds): 13      PHQ Questionnaire  Little interest or pleasure in doing things.: More than half the days  Feeling down, depressed, or hopeless: More than half the days  PHQ 2 Total: 4  Trouble falling or staying asleep, or sleeping too much.: Several Days  Feeling tired or having little energy: More than half the days  Poor appetite or overeating: Not at all  Feeling bad about yourself/ that you are a failure in the past 2 weeks?: Several Days  Trouble concentrating on things in the past 2 weeks?: More than half the days  Moving/Speaking slowly or being fidgety or restless  in the past 2 weeks?: Several Days  Thoughts that you would be better off DEAD, or of hurting yourself in some way.: Not at all  If you checked off any problems, how difficult have these problems made it for you to do your work, take care of things at home, or get along with other people?: Somewhat difficult  PHQ 9 Total: 11  Interpretation of Total Score: 10-14 Moderate depression              07/19/2022     3:53 PM 08/22/2022     3:58 PM 09/19/2022     3:57 PM 11/21/2022     4:37 PM 04/03/2023     4:19 PM 07/06/2023     4:12 PM 01/02/2024     4:30 PM   GAD-7 Questionnaire   Feeling nervous,anxious,on edge 2 3 3 2 2 2 2    Not being able to stop or control worrying 2 2 2 2 1 2 2    Worrying too much about different things 2 2 2 2 1 2 2    Trouble relaxing 2 2 2 2 1 1 2    Being so restless that it is hard to sit still 1 1 1 1 2 2 1    Becoming easily annoyed or irritable 2 2 2 2 2 3 2    Feeling afraid as if something awful might happen 2 1 1 1 2 1 1    How difficult have these problems made it for you to work, take care of things at home, or get along with other people? Not difficult at all Somewhat difficult Somewhat difficult Not difficult at all  Not difficult at all Somewhat difficult   Gad-7 Score Total 13 13 13 12 11 13 12    Interpretation 10-14, moderate anxiety 10-14, moderate anxiety 10-14, moderate anxiety 10-14, moderate anxiety 10-14, moderate  anxiety 10-14, moderate anxiety 10-14, moderate anxiety         Review Flowsheet  More data exists         01/02/2024   FUNCTIONAL HEALTH SCREENING   Because we are aware of abuse and domestic violence today, we ask all patients: Are you being hurt, hit, or frightened by anyone at your home or in your life?  N   Do you have any basic needs within your home that are not being met? (such as Food, Shelter, [employer] fast streamer, Tranportation, paying for bills and/or medications) N      Details                     Bertis Ruddy, LPN  4/78/2956, 16:30    Bertis Ruddy, LPN  21/30/86 5784  Signed     01/02/24 1625   Domestic Violence   Because we are aware of abuse and domestic violence today, we ask all patients: Are you being hurt, hit, or frightened by anyone at your home or in your life?  N   Basic Needs   Do you have any basic needs within your home that are not being met? (such as Food, Shelter, [employer] fast streamer, Tranportation, paying for bills and/or medications) N         OBJECTIVE    BP 108/78   Pulse 53   Temp 36.4 C (97.6 F)   Resp 17   Ht 1.854 m (6\' 1" )   Wt 88.5 kg (195 lb)   SpO2 97%   BMI 25.73 kg/m         General: Pleasant, WNWD, NAD, AAO  Head: Normocephalic, AT,no lesions.   Eyes:  no scleral icterus or injection.  Ears: external ear lobes without d/c, swelling, lesions.    Neck: No obvious JVD.   Chest: Lungs clear, no rales, no rhonchi, no wheezes.   Heart: RRR, no murmurs, no rubs, no gallops.  No clicks..    Back: No pain reported on ROM.    Extremities:  Normal gait, no deformities, no edema.    Neuro: Physiological, no localizing findings.   Skin:  No rashes, no lesions noted.    Psych: Mood and Affect normal    No visits with results within 3 Month(s) from this visit.   Latest known visit with results is:   No results found for any previous visit.       PAST MEDICAL HISTORY    Current Outpatient Medications   Medication Sig    cloNIDine HCL (CATAPRES) 0.1 mg Oral Tablet 1 Tablet (0.1 mg total) (Patient  not taking: Reported on 07/06/2023)    cyanocobalamin (VITAMIN B 12) 1,000 mcg Oral Tablet Take 1 Tablet (1,000 mcg total) by mouth Once a day (Patient not taking: Reported on 01/31/2023)    hydrOXYzine HCL (ATARAX) 50 mg Oral Tablet Take 1 Tablet (50 mg total) by mouth Three times a day as needed for Anxiety    lisinopriL (PRINIVIL) 10 mg Oral Tablet Take 1 Tablet (10 mg total) by mouth Once a day (Patient not taking: Reported on 01/02/2024)    sertraline (ZOLOFT) 100 mg Oral Tablet Take 1 Tablet (100 mg total) by mouth Twice daily    ZEPBOUND 5 mg/0.5 mL Subcutaneous Pen Injector Inject 0.5 mL (5 mg total) under the skin     No Known Allergies  Past Medical History:   Diagnosis Date    Asthma     Hypertension     Migraine          Past Surgical History:   Procedure Laterality Date    REPLACEMENT TOTAL KNEE BILATERAL Bilateral 2007         Family Medical History:    None         Social History     Socioeconomic History    Marital status: Married   Tobacco Use    Smoking status: Never    Smokeless tobacco: Never   Vaping Use    Vaping status: Never Used   Substance and Sexual Activity    Alcohol use: Never    Drug use: Never     Social Determinants of Health     Financial Resource Strain: Low Risk  (05/25/2022)    Financial Resource Strain     SDOH Financial: No   Transportation Needs: Low Risk  (05/25/2022)    Transportation Needs     SDOH Transportation: No   Social Connections: Low Risk  (05/25/2022)    Social Connections     SDOH Social Isolation: 5 or more times a week   Intimate Partner Violence: Low Risk  (05/25/2022)    Intimate Partner Violence     SDOH Domestic Violence: No   Housing Stability: Low Risk  (05/25/2022)    Housing Stability     SDOH Housing Situation: I have housing.     SDOH Housing Worry: No         ASSESSMENT    ICD-10-CM    1. B12 deficiency  E53.8 VITAMIN B12      2. Essential hypertension  I10 COMPREHENSIVE METABOLIC PNL, FASTING     CBC      3. Low T4  R79.89 THYROID STIMULATING HORMONE  (SENSITIVE TSH)     THYROPEROXIDASE (TPO) ANTIBODIES, SERUM      4. 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 Agitation  R45.1       7. Encounter to  discuss test results  Z71.2           PLANS  Vss    Depression screening is positive. Follow up plan of care: Follow up 6 weeks.  I will go ahead and increase his Zoloft to 100 mg twice a day.  Refill hydroxyzine.    Labs from his work were reviewed with him today.  He has had these in the past few months.  We are going to go ahead and repeat some labs here in the next 6-8 weeks and new orders have been placed in printed for the patient.  He was noted to be B12 deficient.    No need for further blood pressure medications due to his weight loss.  Blood pressure is running great.          Medication Orders   Medications    sertraline (ZOLOFT) 100 mg Oral Tablet     Sig: Take 1 Tablet (100 mg total) by mouth Twice daily     Dispense:  180 Tablet     Refill:  0    hydrOXYzine HCL (ATARAX) 50 mg Oral Tablet     Sig: Take 1 Tablet (50 mg total) by mouth Three times a day as needed for Anxiety     Dispense:  90 Tablet     Refill:  3           Records reviewed today:  Pt was educated/ counseled on the decisions made today with their involvement in these plans.  Active listening with pertinent questions asked today.   All questions answered and pt/family voice good understanding.     Immunizations:  (see nursing documentation)   Preventive:    Eat a well balanced, healthy diet with routine exercise.         FOLLOW UP:  Return in about 6 weeks (around 02/13/2024) for annual. .    Patient can return sooner if needed.      Total time spent with patient visit:  24 minutes    The patient/care give was given ample opportunity to ask questions and those questions were answered to his/her satisfaction. A good faith effort was made to reconcile the patient's medications. The patient/caregiver was counseled on any appropriate  vaccinations by the provider and questions were answered. The patient/care giver was told to contact me with any additional questions or concerns, or go to the ED in an emergency.   This note may have been partially generated using M-Modal Fluency Direct system, and there may be some incorrect words, spellings, and punctuation that were not noted in checking the note before saving.    Annamarie Dawley, PA-C. 01/02/24 17:11  [facility]  [facility]  [address]  [place], [place] [address]  Phone [phone]

## 2024-01-02 NOTE — Nursing Note (Signed)
01/02/24 1625   Domestic Violence   Because we are aware of abuse and domestic violence today, we ask all patients: Are you being hurt, hit, or frightened by anyone at your home or in your life?  N   Basic Needs   Do you have any basic needs within your home that are not being met? (such as Food, Shelter, [employer] fast streamer, Tranportation, paying for bills and/or medications) N

## 2024-04-17 ENCOUNTER — Ambulatory Visit (INDEPENDENT_AMBULATORY_CARE_PROVIDER_SITE_OTHER): Payer: Self-pay | Admitting: Physician Assistant

## 2024-08-13 ENCOUNTER — Ambulatory Visit (INDEPENDENT_AMBULATORY_CARE_PROVIDER_SITE_OTHER): Payer: Self-pay | Admitting: Physician Assistant

## 2024-08-24 ENCOUNTER — Encounter (INDEPENDENT_AMBULATORY_CARE_PROVIDER_SITE_OTHER): Payer: Self-pay

## 2024-08-24 ENCOUNTER — Ambulatory Visit

## 2024-08-24 ENCOUNTER — Other Ambulatory Visit: Payer: Self-pay

## 2024-08-24 VITALS — BP 142/92 | HR 105 | Temp 98.5°F | Resp 16 | Ht 73.0 in | Wt 190.0 lb

## 2024-08-24 DIAGNOSIS — K047 Periapical abscess without sinus: Secondary | ICD-10-CM | POA: Insufficient documentation

## 2024-08-24 DIAGNOSIS — K029 Dental caries, unspecified: Secondary | ICD-10-CM | POA: Insufficient documentation

## 2024-08-24 DIAGNOSIS — G501 Atypical facial pain: Secondary | ICD-10-CM | POA: Insufficient documentation

## 2024-08-24 MED ORDER — AMOXICILLIN 875 MG TABLET
875.0000 mg | ORAL_TABLET | Freq: Two times a day (BID) | ORAL | 0 refills | Status: AC
Start: 2024-08-24 — End: 2024-09-03

## 2024-08-24 NOTE — Progress Notes (Signed)
 [facility]  [address]  [place] [facility] [address]  Phone: [phone]  Fax: [phone]      Chief Complaint:   Chief Complaint   Patient presents with    Dental Problem     Present since yesterday      This note was created with assistance from [employer] via capture of conversational audio.  Consent was obtained from the patient prior to recording.    History of Present Illness  Terrence Mejia is a 58 year old male who presents with severe dental pain.    He experiences severe dental pain that is not alleviated by maximum doses of ibuprofen and Tylenol . The pain is localized to a specific area, and he describes his teeth as being in poor condition overall.    He has not seen a dentist and does not have an appointment scheduled. He mentions that his teeth have been deteriorating over the past year, with one row of teeth becoming particularly problematic.    He denies any medication allergies and resides in [place].       Allergies:  Allergies[1]  Medications:    Current Outpatient Medications   Medication Sig    amoxicillin  (AMOXIL ) 875 mg Oral Tablet Take 1 Tablet (875 mg total) by mouth Twice daily for 10 days    cloNIDine HCL (CATAPRES) 0.1 mg Oral Tablet 1 Tablet (0.1 mg total) (Patient not taking: Reported on 08/24/2024)    cyanocobalamin  (VITAMIN B 12) 1,000 mcg Oral Tablet Take 1 Tablet (1,000 mcg total) by mouth Once a day (Patient not taking: Reported on 08/24/2024)    hydrOXYzine  HCL (ATARAX ) 50 mg Oral Tablet Take 1 Tablet (50 mg total) by mouth Three times a day as needed for Anxiety (Patient not taking: Reported on 08/24/2024)    sertraline  (ZOLOFT ) 100 mg Oral Tablet Take 1 Tablet (100 mg total) by mouth Twice daily (Patient not taking: Reported on 08/24/2024)       Review of Systems:  ROS negative except noted in HPI    Physical Exam:  Vital signs:   Vitals:    08/24/24 1104   BP: (!) 142/92   Pulse: (!) 105   Resp: 16   Temp: 36.9 C (98.5 F)   TempSrc: Thermal Scan    SpO2: 98%   Weight: 86.2 kg (190 lb)   Height: 1.854 m (6' 1)   BMI: 25.07    Facility age limit for growth %iles is 20 years.  No LMP for male patient.    Physical Exam  NECK: No cervical lymphadenopathy.  CHEST: Lungs clear to auscultation bilaterally.  CARDIOVASCULAR: Heart regular rate and rhythm, no murmurs.  HEENT:  multiple caries and broken teeth on bottom right, right jaw noticeably swollen with some mild pain.        No results found for any visits on 08/24/24.     Nursing Notes:   Jakie Pina, LPN  90/78/74 8892  Signed   Travel Screening       Question Response    Have you been in contact with someone who was sick? --    Do you have any of the following new or worsening symptoms? None of these    Have you traveled internationally in the last month? No          Travel History   Travel since 07/24/24    No documented travel since 07/24/24       Pina Jakie, LPN  Course: Condition at discharge: good    Assessment and Plan:  Assessment & Plan  Acute dental infection with jaw swelling  Localized infection with jaw swelling, no systemic spread. Pain due to infection, expected relief post-antibiotics.  - Prescribe amoxicillin  500 mg twice daily for 10 days.  - Send prescription to [facility] pharmacy.  - Advise maximum dose of ibuprofen and acetaminophen  for pain.  - Provide work excuse for Monday if needed.    Dental caries and poor dentition  Chronic poor dentition with multiple caries, no current dental care.  - Recommend contacting dental school in [place] for evaluation and treatment.  - Advise calling ahead to confirm availability.       VISIT SUMMARY:  You came in today because of severe dental pain that has not been relieved by over-the-counter medications. You mentioned that your teeth have been deteriorating over the past year, and you have not seen a dentist yet.    YOUR PLAN:  -ACUTE DENTAL INFECTION WITH JAW SWELLING: You have a localized dental infection causing pain and swelling  in your jaw. This type of infection is usually caused by bacteria and should improve with antibiotics. You have been prescribed amoxicillin  500 mg to be taken twice daily for 10 days. The prescription has been sent to [facility] pharmacy. Continue taking the maximum dose of ibuprofen and acetaminophen  for pain relief. If you need a work excuse for Monday, let us  know.    -DENTAL CARIES AND POOR DENTITION: You have chronic dental issues with multiple cavities and overall poor condition of your teeth. Dental caries are caused by tooth decay. It is important to get evaluated and treated by a dentist. We recommend contacting the dental school in [place] for an appointment. Please call ahead to confirm their availability.    INSTRUCTIONS:  Please follow up with a dentist as soon as possible for a comprehensive evaluation and treatment of your dental issues. If you need a work excuse for Monday, let us  know.     Go to Emergency Department immediately for further work up if any concerning symptoms.  Plan was discussed and patient verbalized understanding.  If symptoms are worsening or not improving the patient should return to the Urgent Care for further evaluation.    This note was created with assistance from [employer]bridge via capture of conversational audio. Consent was obtained from the patient and all parties present prior to recording.      Charlie Sharps, NP          [1] No Known Allergies

## 2024-08-24 NOTE — Nursing Note (Signed)
 Travel Screening       Question Response    Have you been in contact with someone who was sick? --    Do you have any of the following new or worsening symptoms? None of these    Have you traveled internationally in the last month? No          Travel History   Travel since 07/24/24    No documented travel since 07/24/24       Kimble Gander, LPN

## 2024-08-27 NOTE — Progress Notes (Signed)
 Called patient and left VM for them to call back and schedule an annual.

## 2024-10-09 ENCOUNTER — Ambulatory Visit: Payer: Self-pay | Attending: Physician Assistant | Admitting: Physician Assistant

## 2024-10-09 ENCOUNTER — Other Ambulatory Visit: Payer: Self-pay

## 2024-10-09 ENCOUNTER — Encounter (INDEPENDENT_AMBULATORY_CARE_PROVIDER_SITE_OTHER): Payer: Self-pay | Admitting: Physician Assistant

## 2024-10-09 VITALS — BP 139/79 | HR 69 | Temp 98.1°F | Resp 18 | Ht 73.0 in | Wt 207.0 lb

## 2024-10-09 DIAGNOSIS — R7989 Other specified abnormal findings of blood chemistry: Secondary | ICD-10-CM | POA: Insufficient documentation

## 2024-10-09 DIAGNOSIS — Z114 Encounter for screening for human immunodeficiency virus [HIV]: Secondary | ICD-10-CM | POA: Insufficient documentation

## 2024-10-09 DIAGNOSIS — I10 Essential (primary) hypertension: Secondary | ICD-10-CM | POA: Insufficient documentation

## 2024-10-09 DIAGNOSIS — Z712 Person consulting for explanation of examination or test findings: Secondary | ICD-10-CM | POA: Insufficient documentation

## 2024-10-09 DIAGNOSIS — Z23 Encounter for immunization: Secondary | ICD-10-CM | POA: Insufficient documentation

## 2024-10-09 DIAGNOSIS — F321 Major depressive disorder, single episode, moderate: Secondary | ICD-10-CM | POA: Insufficient documentation

## 2024-10-09 DIAGNOSIS — F411 Generalized anxiety disorder: Secondary | ICD-10-CM | POA: Insufficient documentation

## 2024-10-09 DIAGNOSIS — Z1159 Encounter for screening for other viral diseases: Secondary | ICD-10-CM | POA: Insufficient documentation

## 2024-10-09 NOTE — Nursing Note (Signed)
 Body mass index is 27.31 kg/m.    Review Flowsheet  More data exists         10/09/2024   FUNCTIONAL HEALTH SCREENING   Have you had a recent unexplained weight loss or gain? N   How often do you have a problem understanding what is told to you about your medical condition?  Never   Because we are aware of abuse and domestic violence today, we ask all patients: Are you being hurt, hit, or frightened by anyone at your home or in your life?  N   In the past year, have you been afraid of your partner or ex-partner? No   Do you feel physically safe and emotionally safe where you currently live? Yes   Do you have any basic needs within your home that are not being met? (such as Food, Shelter, [employer] Fast Streamer, Tranportation, paying for bills and/or medications) N   What is your housing situation? Has Housing   Are you worried about losing your housing? No   What is your current work situation? Full-time   In the past year, have you or any family members you live with been unable to get any of the following when it was really needed?  No   Has lack of transportation kept you from medical appointments, meetings, work, or from getting things needed for daily living?  No   How often do you see or talk to people that you care about and feel close to? (For example: talking to friends on the phone, visiting friends or family, going to church or club meetings) >=5x a wk   Are any of these needs urgent? No   Do you have any advanced directives? No Advance     Fall Risk Assessment  Do you feel unsteady when standing or walking?: Yes  Do you worry about falling?: No  Have you fallen in the past year?: No  Timed up and go test (in seconds): 8    PHQ Questionnaire  Little interest or pleasure in doing things.: More than half the days  Feeling down, depressed, or hopeless: More than half the days  PHQ 2 Total: 4  Trouble falling or staying asleep, or sleeping too much.: More than half the days  Feeling tired or having little energy: Nearly  every day  Poor appetite or overeating: Not at all  Feeling bad about yourself/ that you are a failure in the past 2 weeks?: More than half the days  Trouble concentrating on things in the past 2 weeks?: Several Days  Moving/Speaking slowly or being fidgety or restless  in the past 2 weeks?: More than half the days  Thoughts that you would be better off DEAD, or of hurting yourself in some way.: Not at all  If you checked off any problems, how difficult have these problems made it for you to do your work, take care of things at home, or get along with other people?: Very difficult  PHQ 9 Total: 14  Interpretation of Total Score: 10-14 Moderate depression         Travel Screening       Question Response    Have you been in contact with someone who was sick? --    Do you have any of the following new or worsening symptoms? None of these    Have you traveled internationally in the last month? No          Travel History   Travel since 09/08/24  No documented travel since 09/08/24             08/22/2022     3:58 PM 09/19/2022     3:57 PM 11/21/2022     4:37 PM 04/03/2023     4:19 PM 07/06/2023     4:12 PM 01/02/2024     4:30 PM 10/09/2024     4:07 PM   GAD-7 Questionnaire   Feeling nervous,anxious,on edge 3 3 2 2 2 2 3    Not being able to stop or control worrying 2 2 2 1 2 2 2    Worrying too much about different things 2 2 2 1 2 2 2    Trouble relaxing 2 2 2 1 1 2 2    Being so restless that it is hard to sit still 1 1 1 2 2 1 2    Becoming easily annoyed or irritable 2 2 2 2 3 2 3    Feeling afraid as if something awful might happen 1 1 1 2 1 1 2    How difficult have these problems made it for you to work, take care of things at home, or get along with other people? Somewhat difficult Somewhat difficult Not difficult at all  Not difficult at all Somewhat difficult Very difficult   Gad-7 Score Total 13 13 12 11 13 12 16    Interpretation 10-14, moderate anxiety 10-14, moderate anxiety 10-14, moderate anxiety 10-14, moderate  anxiety 10-14, moderate anxiety 10-14, moderate anxiety 15 or more, severe anxiety            No data to display                Camellia DELENA Cooper, LPN  88/02/7973, 16:10

## 2024-10-09 NOTE — Progress Notes (Signed)
 FAMILY MEDICINE, [facility]  [address]  [place] [address]      Operated by [facility]: [facility]     Name: Terrence Mejia MRN:  [id number]   Date: 10/09/2024 Age: 58 y.o.        SUBJECTIVE    Patient is 58 y.o. he  presents today for   Chief Complaint   Patient presents with    Follow Up     Pt is present today for a follow up. States he is taking no medications at this time . 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         LOV:  01/02/2024 pt seen with vitamin b12 def. Depression screening is positive. Follow up plan of care: Follow up 6 weeks.  I will go ahead and increase his Zoloft  to 100 mg twice a day.  Refill hydroxyzine .     Labs from his work were reviewed with him today.  He has had these in the past few months.  We are going to go ahead and repeat some labs here in the next 6-8 weeks and new orders have been placed in printed for the patient.  He was noted to be B12 deficient.     No need for further blood pressure medications due to his weight loss.  Blood pressure is running great.    As above.   Today patient states that he is doing ok.    Says that the Zoloft  is not helping.     No reported cp, sob, lightheadedness, swelling.     Patient appears to be tolerating meds fine.     Other chronic conditions remain stable.      Last labs:     ROS:  Systems otherwise negative than what has been noted above in the HPI.   PMH, surgical, FH, Social Hx, allergies, meds, all reviewed and up dated today.     Nursing Notes:   Bridgette Camellia LABOR, LPN  88/93/74 [id number]  Signed  Body mass index is 27.31 kg/m.    Review Flowsheet  More data exists         10/09/2024   FUNCTIONAL HEALTH SCREENING   Have you had a recent unexplained weight loss or gain? N   How often do you have a problem understanding what is told to you about your medical condition?  Never   Because we are aware of abuse and domestic violence today, we ask all patients:  Are you being hurt, hit, or frightened by anyone at your home or in your life?  N   In the past year, have you been afraid of your partner or ex-partner? No   Do you feel physically safe and emotionally safe where you currently live? Yes   Do you have any basic needs within your home that are not being met? (such as Food, Shelter, [employer], Tranportation, paying for bills and/or medications) N   What is your housing situation? Has Housing   Are you worried about losing your housing? No   What is your current work situation? Full-time   In the past year, have you or any family members you live with been unable to get any of the following when it was really needed?  No   Has lack of transportation kept you from medical appointments, meetings, work, or from getting things needed for daily living?  No   How often do you see or  talk to people that you care about and feel close to? (For example: talking to friends on the phone, visiting friends or family, going to church or club meetings) >=5x a wk   Are any of these needs urgent? No   Do you have any advanced directives? No Advance     Fall Risk Assessment  Do you feel unsteady when standing or walking?: Yes  Do you worry about falling?: No  Have you fallen in the past year?: No  Timed up and go test (in seconds): 8    PHQ Questionnaire  Little interest or pleasure in doing things.: More than half the days  Feeling down, depressed, or hopeless: More than half the days  PHQ 2 Total: 4  Trouble falling or staying asleep, or sleeping too much.: More than half the days  Feeling tired or having little energy: Nearly every day  Poor appetite or overeating: Not at all  Feeling bad about yourself/ that you are a failure in the past 2 weeks?: More than half the days  Trouble concentrating on things in the past 2 weeks?: Several Days  Moving/Speaking slowly or being fidgety or restless  in the past 2 weeks?: More than half the days  Thoughts that you would be better off DEAD, or  of hurting yourself in some way.: Not at all  If you checked off any problems, how difficult have these problems made it for you to do your work, take care of things at home, or get along with other people?: Very difficult  PHQ 9 Total: 14  Interpretation of Total Score: 10-14 Moderate depression         Travel Screening       Question Response    Have you been in contact with someone who was sick? --    Do you have any of the following new or worsening symptoms? None of these    Have you traveled internationally in the last month? No          Travel History   Travel since 09/08/24    No documented travel since 09/08/24             08/22/2022     3:58 PM 09/19/2022     3:57 PM 11/21/2022     4:37 PM 04/03/2023     4:19 PM 07/06/2023     4:12 PM 01/02/2024     4:30 PM 10/09/2024     4:07 PM   GAD-7 Questionnaire   Feeling nervous,anxious,on edge 3 3 2 2 2 2 3    Not being able to stop or control worrying 2 2 2 1 2 2 2    Worrying too much about different things 2 2 2 1 2 2 2    Trouble relaxing 2 2 2 1 1 2 2    Being so restless that it is hard to sit still 1 1 1 2 2 1 2    Becoming easily annoyed or irritable 2 2 2 2 3 2 3    Feeling afraid as if something awful might happen 1 1 1 2 1 1 2    How difficult have these problems made it for you to work, take care of things at home, or get along with other people? Somewhat difficult Somewhat difficult Not difficult at all  Not difficult at all Somewhat difficult Very difficult   Gad-7 Score Total 13 13 12 11 13 12 16    Interpretation 10-14, moderate anxiety 10-14, moderate anxiety 10-14, moderate anxiety 10-14,  moderate anxiety 10-14, moderate anxiety 10-14, moderate anxiety 15 or more, severe anxiety            No data to display                Camellia DELENA Cooper, LPN  88/02/7973, 16:10      OBJECTIVE    BP 139/79   Pulse 69   Temp 36.7 C (98.1 F)   Resp 18   Ht 1.854 m (6' 1)   Wt 93.9 kg (207 lb)   SpO2 98%   BMI 27.31 kg/m         General: Pleasant, WNWD, NAD, AAO  Head:  Normocephalic, AT,no lesions.   Eyes:  no scleral icterus or injection.  Ears: external ear lobes without d/c, swelling, lesions.    Neck: No obvious JVD.   Chest: Lungs clear, no rales, no rhonchi, no wheezes.   Heart: RRR, no murmurs, no rubs, no gallops.  No clicks..    Back: No pain reported on ROM.    Extremities: Normal gait, no deformities, no edema.    Neuro: Physiological, no localizing findings.   Skin:  No rashes, no lesions noted.    Psych: Mood and Affect normal    No visits with results within 3 Month(s) from this visit.   Latest known visit with results is:   No results found for any previous visit.       PAST MEDICAL HISTORY    Current Outpatient Medications   Medication Sig    cloNIDine HCL (CATAPRES) 0.1 mg Oral Tablet 1 Tablet (0.1 mg total) (Patient not taking: Reported on 10/09/2024)    cyanocobalamin  (VITAMIN B 12) 1,000 mcg Oral Tablet Take 1 Tablet (1,000 mcg total) by mouth Once a day (Patient not taking: Reported on 10/09/2024)    hydrOXYzine  HCL (ATARAX ) 50 mg Oral Tablet Take 1 Tablet (50 mg total) by mouth Three times a day as needed for Anxiety (Patient not taking: Reported on 10/09/2024)    sertraline  (ZOLOFT ) 100 mg Oral Tablet Take 1 Tablet (100 mg total) by mouth Twice daily (Patient not taking: Reported on 10/09/2024)     Allergies[1]  Past Medical History:   Diagnosis Date    Asthma     Hypertension     Migraine          Past Surgical History:   Procedure Laterality Date    REPLACEMENT TOTAL KNEE BILATERAL Bilateral 2007         Family Medical History:    None         Social History     Socioeconomic History    Marital status: Married   Tobacco Use    Smoking status: Never    Smokeless tobacco: Never   Vaping Use    Vaping status: Never Used   Substance and Sexual Activity    Alcohol use: Never    Drug use: Never     Social Determinants of Health     Financial Resource Strain: Low Risk (10/09/2024)    Financial Resource Strain     SDOH Financial: No   Transportation Needs: Low Risk  (10/09/2024)    Transportation Needs     SDOH Transportation: No   Social Connections: Low Risk (10/09/2024)    Social Connections     SDOH Social Isolation: 5 or more times a week   Intimate Partner Violence: Low Risk (10/09/2024)    Intimate Partner Violence     SDOH Domestic Violence: No  Housing Stability: Low Risk (10/09/2024)    Housing Stability     SDOH Housing Situation: I have housing.     SDOH Housing Worry: No         ASSESSMENT    ICD-10-CM    1. Elevated TSH  R79.89 THYROPEROXIDASE (TPO) ANTIBODIES, SERUM      2. Essential hypertension  I10       3. 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 Need for vaccination against hepatitis B virus  Z23       6. Screening for HIV (human immunodeficiency virus)  Z11.4       7. Need for hepatitis C screening test  Z11.59       8. Encounter to discuss test results  Z71.2           PLANS  Vital signs stable.      The patient has gained some weight back and we encouraged him to continue to work on weight loss.  He has a GLP 1 sent over to his pharmacy already but he is not sure he wants to start taking it.  This was sent by his work clinic.    We are going to monitor his blood pressure for now.  It is went up a little bit but he was at the lower end of normal in his last visit.  I think some of his weight gain is causes blood pressure to go up a little bit.  If you can get his weight back down 5-10 lb it should take care of his blood pressure.  If it does not I am going to encouraged him to start blood pressure medication again.  He is reluctant to start anything.      I have talked to him about his PHQ-9 and depression.  Talked to him about gene site testing which may help modify some of his treatment rather than trial and error.  At this point he wants to hold off on anything and says he is okay where he is at.    He has reviewed his labs with me from his work.  His TSH is slightly elevated in his free T4 is  in the lower end of normal.  At this point I would recommend he hold off on any medication and have this rechecked in a couple months.  At time he can also have TPO antibodies ordered which I have printed for him today.  If he has continue have elevated TSH and low free T4 levels as well as a positive TPO antibodies then I would recommend he start the medications.  Especially if he has symptoms.  Now he says he feels overall pretty good.  Nothing more than his usual slight tiredness.    You does not want HIV or hepatitis-C screening.  Declines.  Current ACE or ARB:   Active ACE/ARB Meds   Medication Sig   None             Medication Orders   No Medications ordered           Records reviewed today:  Pt was educated/ counseled on the decisions made today with their involvement in these plans.  Active listening with pertinent questions asked today.   All questions answered and pt/family voice good understanding.     Immunizations:  (see nursing documentation)   Preventive:  Eat a well balanced, healthy diet with routine exercise.         FOLLOW UP:  Return in about 9 months (around 07/09/2025) for annual.  .    Patient can return sooner if needed.      Total time spent with patient visit:  25 minutes    The patient/care give was given ample opportunity to ask questions and those questions were answered to his/her satisfaction. A good faith effort was made to reconcile the patient's medications. The patient/caregiver was counseled on any appropriate vaccinations by the provider and questions were answered. The patient/care giver was told to contact me with any additional questions or concerns, or go to the ED in an emergency.   This note may have been partially generated using M-Modal Fluency Direct system, and there may be some incorrect words, spellings, and punctuation that were not noted in checking the note before saving.    Franky GORMAN Bare, PA-C. 10/09/24 16:59  [facility]  [facility]  [address]  [place], [place]  [address]  Phone [phone]         [1] No Known Allergies

## 2024-12-10 ENCOUNTER — Ambulatory Visit (INDEPENDENT_AMBULATORY_CARE_PROVIDER_SITE_OTHER): Payer: Self-pay | Admitting: Physician Assistant

## 2025-07-14 ENCOUNTER — Ambulatory Visit (INDEPENDENT_AMBULATORY_CARE_PROVIDER_SITE_OTHER): Payer: Self-pay | Admitting: Physician Assistant
